# Patient Record
Sex: Female | Born: 1975 | Race: White | Hispanic: No | Marital: Married | Smoking: Never smoker
Health system: Southern US, Community
[De-identification: ages and names within clinical notes are randomized; demographics above are authoritative.]

---

## 1998-05-15 ENCOUNTER — Emergency Department (HOSPITAL_COMMUNITY): Admission: EM | Admit: 1998-05-15 | Discharge: 1998-05-15 | Payer: Self-pay | Admitting: Emergency Medicine

## 2000-12-24 ENCOUNTER — Encounter: Payer: Self-pay | Admitting: Emergency Medicine

## 2000-12-24 ENCOUNTER — Emergency Department (HOSPITAL_COMMUNITY): Admission: EM | Admit: 2000-12-24 | Discharge: 2000-12-24 | Payer: Self-pay | Admitting: Emergency Medicine

## 2001-05-18 ENCOUNTER — Other Ambulatory Visit: Admission: RE | Admit: 2001-05-18 | Discharge: 2001-05-18 | Payer: Self-pay | Admitting: Obstetrics and Gynecology

## 2001-12-07 ENCOUNTER — Inpatient Hospital Stay (HOSPITAL_COMMUNITY): Admission: AD | Admit: 2001-12-07 | Discharge: 2001-12-11 | Payer: Self-pay | Admitting: Obstetrics and Gynecology

## 2001-12-12 ENCOUNTER — Encounter: Admission: RE | Admit: 2001-12-12 | Discharge: 2002-01-11 | Payer: Self-pay | Admitting: Obstetrics and Gynecology

## 2001-12-14 ENCOUNTER — Inpatient Hospital Stay (HOSPITAL_COMMUNITY): Admission: AD | Admit: 2001-12-14 | Discharge: 2001-12-16 | Payer: Self-pay | Admitting: *Deleted

## 2002-01-20 ENCOUNTER — Other Ambulatory Visit: Admission: RE | Admit: 2002-01-20 | Discharge: 2002-01-20 | Payer: Self-pay | Admitting: Obstetrics and Gynecology

## 2002-11-28 ENCOUNTER — Other Ambulatory Visit: Admission: RE | Admit: 2002-11-28 | Discharge: 2002-11-28 | Payer: Self-pay | Admitting: Obstetrics & Gynecology

## 2003-06-15 ENCOUNTER — Inpatient Hospital Stay (HOSPITAL_COMMUNITY): Admission: AD | Admit: 2003-06-15 | Discharge: 2003-06-15 | Payer: Self-pay | Admitting: Obstetrics & Gynecology

## 2003-06-26 ENCOUNTER — Inpatient Hospital Stay (HOSPITAL_COMMUNITY): Admission: RE | Admit: 2003-06-26 | Discharge: 2003-07-03 | Payer: Self-pay | Admitting: Obstetrics & Gynecology

## 2003-06-26 ENCOUNTER — Encounter (INDEPENDENT_AMBULATORY_CARE_PROVIDER_SITE_OTHER): Payer: Self-pay | Admitting: Specialist

## 2003-08-01 ENCOUNTER — Other Ambulatory Visit: Admission: RE | Admit: 2003-08-01 | Discharge: 2003-08-01 | Payer: Self-pay | Admitting: Obstetrics & Gynecology

## 2005-02-07 ENCOUNTER — Other Ambulatory Visit: Admission: RE | Admit: 2005-02-07 | Discharge: 2005-02-07 | Payer: Self-pay | Admitting: Obstetrics & Gynecology

## 2006-07-03 ENCOUNTER — Emergency Department: Payer: Self-pay | Admitting: Internal Medicine

## 2007-02-11 HISTORY — PX: ABDOMINAL HYSTERECTOMY: SHX81

## 2007-08-24 ENCOUNTER — Encounter (INDEPENDENT_AMBULATORY_CARE_PROVIDER_SITE_OTHER): Payer: Self-pay | Admitting: Obstetrics & Gynecology

## 2007-08-24 ENCOUNTER — Ambulatory Visit (HOSPITAL_COMMUNITY): Admission: RE | Admit: 2007-08-24 | Discharge: 2007-08-25 | Payer: Self-pay | Admitting: Obstetrics & Gynecology

## 2010-06-25 NOTE — H&P (Signed)
NAME:  Jasmine Jimenez, Jasmine Jimenez NO.:  [id number]   MEDICAL RECORD NO.:  [id number]          PATIENT TYPE:  AMB   LOCATION:  SDC                           FACILITY:  [facility]   PHYSICIAN:  Freddy Finner, M.D.   DATE OF BIRTH:  1975/10/08   DATE OF ADMISSION:  DATE OF DISCHARGE:                              HISTORY & PHYSICAL   ADMISSION DIAGNOSES:  1. Chronic pelvic pain.  2. Menorrhagia.  3. Ultrasound findings consistent with adenomyosis.  4. Failed control of symptoms with oral contraceptives.  5. Request for definitive surgical intervention, admitted now for      laparoscopically assisted vaginal hysterectomy.   HISTORY OF PRESENT ILLNESS:  The patient is a 35 year old white married  female with two living children, both of whom were delivered by cesarean  section, the first for failure to progress, the second is a repeat.  She  also had bilateral tubal sterilization at the time of her second C-  section and had a postoperative wound requiring primary closure of the  dehiscence.  For the last approximately two years the patient has  continued to complain of severe dysmenorrhea/menorrhagia.  She has been  tried on oral contraceptives for controlled bleeding even though she did  not need contraceptive protection.  Options of therapy were discussed  with her including endometrial ablation, although adenomyosis is a  relative contraindication for that procedure.  She also has been off her  intrauterine device.  Based on her definite decision already for no  further pregnancies, she has requested definitive surgical intervention  and is admitted now for the procedure noted above.  Because of her  previous abdominal procedures the possibility of injury to other organs  on entry has been discussed with the patient and the possibility of  having to complete the procedure by abdominal incision.  Also risk of  infection into the bladder, ureters, and vessels has been discussed  with  her and the possibility of intraoperative hemorrhage.  Prophylactic  measures intended to prevent these things including deep vein thrombosis  have been discussed and will be utilized.  She is now admitted for  surgery.   REVIEW OF SYSTEMS:  Her current review of systems:  There are no other  reported JR or GU symptoms and no cardiopulmonary symptoms.  She does  have a history of chronic back pain.  She has no other known significant  medical illnesses.  She does have anxiety disorder for which she takes  Paxil 10 mg a day.  She has allergic rhinitis for which she takes  Zyrtec.   PAST SURGICAL HISTORY:  Previous surgical procedures in addition to the  above noted procedures include wisdom teeth extraction.   PAST MEDICAL HISTORY:  She does have a history of chronic idiopathic  urticaria.   ALLERGIES:  She also has an allergy to AMOXICILLIN, but to no other  antibiotics or other drugs.   SOCIAL HISTORY:  She has never required a blood transfusion.  She is not  a cigarette smoker.   FAMILY HISTORY:  Noncontributory.   PHYSICAL EXAMINATION:  HEENT:  Grossly within normal limits.  VITAL SIGNS:  Blood pressure in the office 110/62, height 5 feet 7-1/2  inches, weight 259 pounds.  NECK:  Thyroid gland is not palpably enlarged.  BREASTS:  Breast exam was considered to be normal.  No palpable masses,  no nipple discharge or skin change.  HEART:  Normal sinus rhythm without murmurs, rubs, or gallops.  CHEST:  Clear to auscultation throughout.  ABDOMEN:  Moderately obese, but no palpable organomegaly or palpable  masses.  PELVIC:  External genitalia, vagina, and cervix are normal to  inspection.  Her exam is compromised by body habitus but there is no  palpable enlargement on bimanual examination and no palpable adnexal  masses.  RECTUM:  Normal.  Rectovaginal exam confirms the above findings.   ASSESSMENT:  1. Chronic pelvic pain, severe dysmenorrhea/menorrhagia,  unresponsive      to cyclic hormonal therapy, specifically oral contraceptives.  2. The patient does have recent history of left low back pain and left      leg pain, a recent symptom which is not thought to be GYN in origin      and the possibility of lumbar disk has been discussed with the      patient.   PLAN:  Laparoscopically-assisted vaginal hysterectomy, serial  compression devices for antiembolic prophylaxis of the antibiotic for  infection prophylaxis.      Freddy Finner, M.D.  Electronically Signed     WRN/MEDQ  D:  08/23/2007  T:  08/23/2007  Job:  [id number]

## 2010-06-25 NOTE — Op Note (Signed)
NAME:  Jasmine Jimenez, Jasmine Jimenez NO.:  [id number]   MEDICAL RECORD NO.:  [id number]          PATIENT TYPE:  OIB   LOCATION:  9315                          FACILITY:  [facility]   PHYSICIAN:  Freddy Finner, M.D.   DATE OF BIRTH:  05-02-1975   DATE OF PROCEDURE:  08/24/2007  DATE OF DISCHARGE:  08/25/2007                               OPERATIVE REPORT   PREOPERATIVE DIAGNOSES:  Myometrial thickening and uterine enlargement  by ultrasound, consistent with adenomyosis.  Clinical symptoms of  menorrhagia, severe dysmenorrhea.   POSTOPERATIVE DIAGNOSES:  Myometrial thickening and uterine enlargement  by ultrasound, consistent with adenomyosis.  Clinical symptoms of  menorrhagia, severe dysmenorrhea.   SECONDARY DIAGNOSIS:  Omental adhesions to the anterior abdominal wall  from previous abdominal surgery.   OPERATIVE PROCEDURE:  Laparoscopically-assisted vaginal hysterectomy.   SURGEON:  Freddy Finner, M.D.   ASSISTANT:  Zelphia Cairo, M.D.   ESTIMATED INTRAOPERATIVE BLOOD LOSS:  Less than or equal to 300 mL.   INTRAOPERATIVE COMPLICATIONS:  None.   NOTE:  I am dictating this note on August 28, 2007.  It became apparent to  me today when I checked medical records, that I had not dictated the  note.  I am using photographs made at the time of surgery which will be  retained in the office records to refresh my memory and confirm the  accuracy of this dictation.   DESCRIPTION OF PROCEDURE:  The patient was admitted on the morning of  surgery.  She was given a bolus of Ancef IV.  She was taken to the  operating room and there placed under adequate general endotracheal  anesthesia.  She was placed in serial compression devices on her lower  extremities.  After establishing general anesthesia, the abdomen,  perineum and vagina were prepped in the usual fashion.  Bladder was  evacuated using a sterile catheter.  Hulka tenaculum was attached to the  cervix without difficulty.   A  small infraumbilical skin incision was made, and through it an 11 mm  blade and disposable trocar introduced.  Direct inspection revealed  adequate placement and no evidence of injury on entry.  Pneumoperitoneum  was allowed to accumulate with carbon dioxide gas.  A second, smaller  incision was made just above the symphysis and through it a 5 mm trocar  was placed again, under direct visualization.  Previously a blunt probe  and later an __________system and spring-loaded grasping forceps were  used for traction, irrigation and exposure during the procedure.  Inspection of the abdomen revealed no apparent abnormalities except for  omental adhesions to the anterior abdominal wall.  These were in no way  producing a blind loop or attached to bowel or other structures, and  were not removed.  There was what appeared to be fatty change of the  liver, but no other apparent abnormality.  In the upper abdomen, the  uterus itself was noted to be significantly enlarged.  Tubes and ovaries  were normal.  Peritoneal surfaces in the pelvis were normal.  The new  Ethicon tripolar device was  used through the operating channel of the  laparoscope to develop, seal, and divide the utero-ovarian pedicle of  the fallopian tube, the round ligament, the upper broad ligament down to  a level just above the uterine arteries on each side.  The bladder was  released anteriorly.   Attention was then turned vaginally.  The posterior weighted vaginal  retractor was placed.  Deaver retractor were used anteriorly and  laterally for exposure.  The cervix was grasped with a Christella Hartigan after  removing the Hulka, colpotomy incision was made while tenting the mucosa  posterior to the cervix.  The cervix was circumscribed with a scalpel to  release the mucosa.  The banana style weighted retractor was placed.  The LigaSure system was then used to seal and divide the uterosacral  pedicles and bladder __________.  The bladder was  carefully advanced off  the cervix with blunt and sharp dissection.  Anterior peritoneum was  entered.  The cardinal ligament pedicles were sealed and divided with  LigaSure system.  The dorsal pedicles were sealed and divided with the  LigaSure system.  This allowed delivery of the uterus through the  vaginal introitus.  The regular posterior weighted retractor was placed.  The angles of the vagina were anchored to the uterosacrals with mattress  sutures of 0 Monocryl.  Uterosacrals were plicated.  The posterior  peritoneal closed with interrupted 0 Monocryl.  Fascia was closed  vertically with figures-of-eight with 0 Monocryl.  Foley catheter was  placed.  Reinspection abdominally through the laparoscope was carried  out.  The  __________ system is utilized.  Blood and fluid in the  abdomen are aspirated.  Complete hemostasis was confirmed under  additional intra-abdominal pressure and irrigation.  At this point the  procedure was terminated.  The instruments were removed.  Gas was  allowed to escape from the abdomen.  Skin incisions were anesthetized  with 0.25% Marcaine.  The skin incisions were closed with interrupted  subcuticular sutures of 3-0 Dexon.  Steri-Strips were applied to the  lower incision.  Sterile dressing was applied at the umbilicus.   The patient was awakened, taken to recovery in good condition.      Freddy Finner, M.D.  Electronically Signed     WRN/MEDQ  D:  08/28/2007  T:  08/28/2007  Job:  [id number]

## 2010-06-28 NOTE — H&P (Signed)
NAME:  Jasmine Jimenez, Jasmine Jimenez                           ACCOUNT NO.:  [id number]   MEDICAL RECORD NO.:  [id number]                   PATIENT TYPE:  INP   LOCATION:  9101                                 [facility]ACILITY:  [facility]   PHYSICIAN:  Freddy Finner, M.D.                DATE OF BIRTH:  1975/05/20   DATE OF ADMISSION:  06/26/2003  DATE OF DISCHARGE:                                HISTORY & PHYSICAL   ADMISSION DIAGNOSES:  1. Intrauterine pregnancy at term.  2. Surgically scarred uterus.  3. History of severe pregnancy induced hypertension with first pregnancy.  4. Declines vaginal birth after cesarean.   HISTORY OF PRESENT ILLNESS:  The patient is a 35 year old Gravida II, Para 1  who had severe PIH with her first pregnancy and had actual loss of vision in  her right eye following that delivery.  This subsequently resolved and this  produced no additional problems, but the patient has had significant anxiety  over potential recurrence of problems during this pregnancy.  Fortunately  her prenatal course has been uncomplicated.  Her blood pressure has remained  normal with no evidence of recurrence of the pregnancy induced hypertension.   REVIEW OF SYMPTOMS:  Current review of systems is negative.   PAST MEDICAL HISTORY:  Recorded in detail in the prenatal summary and will  not be repeated.   SOCIAL HISTORY:  Recorded in detail in the prenatal summary and will not be  repeated.   PHYSICAL EXAMINATION:  HEENT:  Grossly within normal limits.  Thyroid gland  is not palpably enlarged.  CHEST:  Clear to auscultation.  HEART:  Normal sinus rhythm without murmurs, rubs or gallops.  ABDOMEN:  Gravid.  Estimated fetal weight is 8 pounds or less.  PELVIC EXAM:  Deferred.  EXTREMITIES:  +1 edema without cyanosis or clubbing.   ASSESSMENT:  1. Intrauterine pregnancy at term, uncomplicated prenatal course.  2. Surgically scarred uterus.   PLAN:  Repeat cesarean delivery.                        Freddy Finner, M.D.    WRN/MEDQ  D:  06/26/2003  T:  06/26/2003  Job:  [id number]

## 2010-06-28 NOTE — Op Note (Signed)
NAME:  Jasmine Jimenez, Jasmine Jimenez                           ACCOUNT NO.:  [id number]   MEDICAL RECORD NO.:  [id number]                   PATIENT TYPE:  INP   LOCATION:  9101                                 [facility]ILITY[facility]   PHYSICIAN:  Freddy Finner, M.D.                DATE OF BIRTH:  Jan 04, 1976   DATE OF PROCEDURE:  06/26/2003  DATE OF DISCHARGE:                                 OPERATIVE REPORT   PREOPERATIVE DIAGNOSES:  1. Intrauterine pregnancy at term.  2. Surgically-scarred uterus.  3. Refused vaginal birth after cesarean.   SECONDARY DIAGNOSIS:  Multiparity, request for sterilization.   OPERATIVE PROCEDURE:  Repeat cesarean section and bilateral tubal ligation.   ANESTHESIA:  Spinal.   INTRAOPERATIVE COMPLICATIONS:  Initial difficulty with delivery due to  marked scarring of the incision and anterior abdominal wall.   OTHER INTRAOPERATIVE COMPLICATIONS:  None.   The infant was a viable female, Apgars were 8 and 9, arterial cord pH was  7.16.   The patient is a 35 year old with one living child who delivered by cesarean  with her first baby.  She is admitted now after an uneventful prenatal  course for repeat cesarean delivery.  She was admitted on the morning of  surgery.  She was brought to the operating room, there placed under spinal  anesthesia, placed in the dorsal recumbent position.  Some difficulty was  experienced with the spinal with getting a complete block and repositioning  did help some, but local infusion of 0.25% Marcaine was required for the  procedure and this included anesthetizing the skin and fascia on the right  side of the incision.  The incision was made through an old scar and carried  sharply down to fascia.  The fascia was entered sharply and extended to the  extent of skin incision.  The rectus sheath was developed superiorly and  inferiorly with blunt and sharp dissection.  Marked scarring was noted along  the way.  The rectus muscles were divided in the  midline.  The peritoneum  was elevated, entered sharply, and extended bluntly to the extent of the  skin incision.  The scarring restricted the opening.  It was felt that under  standard technique all had been accomplished that could be and for that  reason an incision was made in the lower uterine segment above the bladder  reflection.  The fluid was clear.  The uterine incision was extended bluntly  with transverse dissection.  The infant was right occiput transverse.  It  was floating out of the pelvis. Initial attempts to apply the [facility] and  another type of vacuum extractor were unsuccessful.  At this point it was  elected to extend the incision in the uterus in a vertical fashion at the  midline of the incision and to extend an incision in the anterior abdominal  wall through the fascia and subcutaneous tissue and  skin.  This did in fact  then allow delivery of the infant.  A single loose nuchal cord was removed.  There was a moderate amount of bleeding before actual delivery of the fetus.  The Apgars are noted above and cord pH is noted above.  Placenta and other  products of conception were then removed from the uterus.  The uterus was  delivered onto the anterior abdominal wall.  Inspection revealed normal  tubes and ovaries.  The uterus itself was normal except for some very tiny  subserosal myomas noted on the anterior fundal surface.  These measured no  more than 5 mm each and were very flat.  It was elected not to attempt to  dissect these.  The uterine incision was then closed with a running locking  0 Monocryl.  The mid portion of each tube was elevated, doubly ligated with  0 plain ties, and a segment of tube excised.  When an attempt was made to  deliver the uterus back into the abdominal cavity, the tie on the right tube  evulsed.  The tube was then clamped and each side ligated again with 0 plain  ties.  This produced complete hemostasis.  Irrigation was carried out.  The   abdominal incision was then closed in layers.  Running 0 Monocryl was used  to close the rectus muscles.  The vertical incision on the uterus was also  closed with a running 0 Monocryl before complete closure of the uterine  incision.  On the abdominal portion of the incision, the fascia was closed  with a running 0 PDS running from the apex down to the primary incision  line.  The transverse fascial incision was then closed with a running  locking 0 Monocryl.  A 2-0 plain suture on a large needle was then used to  approximate the subcutaneous tissue of the vertical portion of the incision  and the subcutaneous tissue immediately inferior to the vertical portion of  the transverse incision.  The skin was then closed with wide skin staples.  The patient tolerated the operative procedure well.  She was awakened as she  had been sedated somewhat.  She was then taken to the recovery room in good  condition.                                               Freddy Finner, M.D.    WRN/MEDQ  D:  06/26/2003  T:  06/26/2003  Job:  [id number]

## 2010-06-28 NOTE — Discharge Summary (Signed)
NAME:  Jasmine Jimenez, Jasmine Jimenez                           ACCOUNT NO.:  [id number]   MEDICAL RECORD NO.:  [id number]                   PATIENT TYPE:  INP   LOCATION:  9101                                 [facility]   PHYSICIAN:  Juluis Mire, M.D.                DATE OF BIRTH:  14-Aug-1975   DATE OF ADMISSION:  06/26/2003  DATE OF DISCHARGE:  07/03/2003                                 DISCHARGE SUMMARY   ADMISSION DIAGNOSES:  1. Intrauterine pregnancy at term.  2. Surgically scarred uterus, refuses vaginal birth after cesarean section.  3. Multiparity, requests sterilization.   DISCHARGE DIAGNOSIS:  1. Status post low transverse cesarean section.  2. Viable female infant.  3. Permanent sterilization.  4. Status post exploratory laparotomy with primary closure of wound     dehiscence.   PROCEDURES:  1. Repeat low transverse cesarean section.  2. Bilateral tubal ligation.  3. Exploratory laparotomy with primary closure of wound dehiscence.   REASON FOR ADMISSION:  Please see dictated H&P.   HOSPITAL COURSE:  The patient was a 35 year old gravida 2, para 1 who was  admitted to [facility] for scheduled cesarean section.  The  patient had had severe PIH with her first pregnancy and she had had  significant anxiety over the potential of reoccurrence of problems during  this pregnancy.  Fortunately, her prenatal care had been uncomplicated.  Blood pressure had remained normal with no evidence of reoccurrence of  pregnancy induced hypertension.  On the morning of admission, the patient  was taken to the operating room where spinal anesthesia was administered  without difficulty.  A low transverse incision was made with the delivery of  a viable female infant weighing 7 pounds 2 ounces with Apgars of 8 at one  minute and 9 at five minutes.  Umbilical cord pH was 7.16.  The patient  tolerated the procedure well and was taken to the recovery room in stable  condition.   On  postoperative day #1, the patient was without complaints.  Vital signs  were stable.  She was afebrile.  Abdomen was soft with good return of bowel  function.  Fundus was firm and nontender.  Abdominal dressing was partially  removed revealing an incision that was clean, dry and intact; however, it  was T-shaped.  Lungs were clear to auscultation.  Labs revealed hemoglobin  of 8.0.   On postoperative day #2, the patient was without complaints.  Vital signs  were stable.  Abdomen was soft.  Fundus was firm and nontender.  Incision  was noted to have a small amount of bleeding noted from the lower one third  of the T-shaped incision.  Steri-Strips were replaced and abdominal dressing  was reapplied.  The patient was ambulating well.  Labs revealed hemoglobin  of 8.2.  Platelet count of 178,000, wbc count of 7.4.   On postoperative day #3,  vital signs were stable.  She was afebrile.  Abdomen was soft.  Fundus was u-even and slightly tender to examination.  Incision was noted to have serosanguineous fluids leaking from the lower one  third of the incision.  Findings were discussed with Dr. Henderson Cloud who  remained from the lower one third of the T-shaped incision.  Omentum was  immediately protruding from the skin.  Sterile wet dressing was applied and  the patient was transferred from the operating room for exploratory  laparotomy and closure of her wound dehiscence.  After transferring the  patient to the operating room, general anesthesia was administered.  Abdomen  was prepped and the patient was given Avelox, IV antibiotics  prophylactically.  Entire vertical fascial incision was noted to be apart  once the Steri-Strips and staples were removed.  Omentum was protruding  through the incision.  Very careful inspection revealed that no bowel had  dropped below the level of the uterine fundus and there was no bowel at all  noted to be at the incisional site.  There were two small adhesions of   omentum that were adhered to the incision which were taken down with  cautery.  A very small edge of the omentum was removed where it had been  adherent to the incisional site.  The remainder of the omentum looked vital  without evidence of strangulation or other lesion.  Continuing suture was  removed from the Pfannenstiel and portion of the incision as well and al the  old suture was taken away.  Peritoneum was closed and the vertical aspect of  the fascia was closed.  The Pfannenstiel portion of the incision was also  closed in a very similar fashion.  A pressure dressing was applied.  The  patient tolerated the procedure well and was then taken to the recovery room  in stable condition.  The following morning, vital signs were stable.  The  patient was afebrile.  Abdomen was soft with good return of bowel function.  Abdominal dressing was noted to be clean, dry and intact.  Fundus was firm.  Urine output was adequate.   On postoperative day #2 of the exploratory laparotomy, vital signs were  stable.  Hemoglobin was noted to be 8.9.  Abdominal dressing was clean, dry  and intact.  She was ambulating well without assistance.  The baby continued  to be in the NICU.   On postoperative day #3 the exploratory laparotomy, the baby continued in  the NICU.  Vital signs were stable.  Abdomen was soft with good return of  bowel function.  Incision was clean, dry and intact. The patient was  tolerating a regular diet without complications of nausea and vomiting.  On  the following morning, baby was doing well.  Baby had been noted to have  some seizures which were thought to be secondary to some low calcium levels.  Vital signs were stable.  Abdomen was soft.  Fundus was firm and nontender.  Incision was clean, dry and intact.  Instructions were given and the patient  was discharged home.   CONDITION ON DISCHARGE:  Good.   DIET:  Regular as tolerated.  ACTIVITY:  No heavy lifting, no driving  x2 weeks, no vaginal entry.   FOLLOW UP:  The patient is to follow up in the office in two to three days  for removal of staples and she should return to the office in approximately  one week for an incision check.  She  is to call for temperature greater than  100 degrees, persistent nausea and vomiting, heavy vaginal bleeding, and/or  redness or drainage from the incisional site.   DISCHARGE MEDICATIONS:  1. Tylox #30 one p.o. q.4-6h. p.r.n. pain.  2. Iron supplementation.  3. Ferrous sulfate 325 mg one p.o. b.i.d.  4. Prenatal vitamins one p.o. daily.     Julio Sicks, N.P.                        Juluis Mire, M.D.    CC/MEDQ  D:  07/23/2003  T:  07/24/2003  Job:  [id number]   cc:   Juluis Mire, M.D.  [address] [place]  [place] [address]  Fax: [phone]

## 2010-06-28 NOTE — Discharge Summary (Signed)
NAME:  Jasmine Jimenez, Jasmine Jimenez NO.:  [id number]   MEDICAL RECORD NO.:  [id number]                   PATIENT TYPE:   LOCATION:                                       FACILITY:   PHYSICIAN:  Julio Sicks, N.P.                  DATE OF BIRTH:   DATE OF ADMISSION:  DATE OF DISCHARGE:                                 DISCHARGE SUMMARY   ADMISSION DIAGNOSES:  1. Intrauterine pregnancy at 62 and 5/7th weeks estimated gestational age.  2. Pregnancy induced hypertension.  3. Induction of labor.   DISCHARGE DIAGNOSES:  1. Status post low transverse Cesarean section secondary to failure to     progress.  2. Viable female infant.   PROCEDURE:  Primary low transverse Cesarean section.   REASON FOR ADMISSION:  Please see written history and physical.   HISTORY OF PRESENT ILLNESS:  The patient is a 35 year old female at 52 and  5/7th weeks estimated gestational age. She presented to the office for a  routine OB visit, where blood pressure was noted to be 190/104 and with 1+  proteinuria. The patient also complained of a mild headache and mild right  upper quadrant pain. Cervix was fingertip dilated.   HOSPITAL COURSE:  The patient was admitted for induction of labor. Cytotec  was administered inter-vaginally for cervical ripening. Pitocin was  administered IV. Deep tendon reflexes of 1-2+ without clonus. Fetal heart  tones reactive in the 140's. After 8-10 hours of IV Pitocin, cervix was  essentially unchanged. She had worsening of her clinical symptoms and  hypertension during labor and was started on magnesium sulfate. Due to  failure to progress, decision was made to proceed with a Cesarean delivery.  The patient was taken to the OR where spinal anesthesia was administered  without difficulty. A low transverse incision was made with the delivery of  a viable female infant weighing 7 pounds and 2 ounces with Apgar's of 9 at one  minute and 9 at five minutes. The  patient tolerated the procedure well and  was taken to the recovery room  in stable condition. On postoperative day  one, patient was taken to the AICU where magnesium sulfate was administered  for 24 hours. Abdomen was soft and nontender. Abdominal dressing was clean,  dry, and intact. On postoperative day two, the patient was stable. Abdomen  was soft. Incision was clean, dry, and intact. The patient was tolerating a  regular diet without complaints of nausea and vomiting. She was started on  Zoloft. On postoperative day three, abdomen was soft. Incision was clean,  dry, and intact. Staples were removed and the patient was discharged to  home.   LABORATORY DATA:  Hemoglobin 9.5, platelet count 199,000, WBC count 12.1 and  liver function studies were within normal limits.   CONDITION ON DISCHARGE:  Good.   DIET:  Regular as tolerated.  ACTIVITY:  No heavy lifting, no vaginal entry, and no driving times two  weeks.   FOLLOW UP:  The patient is to follow-up in the office in one to two weeks  for an incision check. She is to call for temperature greater than 100  degrees, persistent nausea and vomiting, heavy vaginal bleeding and/or  redness or drainage of the incision site.   DISCHARGE MEDICATIONS:  1. Tylox #30 one by mouth every four to six hours as needed pain.  2. Motrin 600 mg every six hours as needed.  3. Prenatal vitamins one by mouth daily.  4. Colace one by mouth daily as needed.  5. Zoloft 50 mg one by mouth daily.                                               Julio Sicks, N.P.    CC/MEDQ  D:  02/04/2002  T:  02/05/2002  Job:  [id number]   cc:   Dineen Kid. Rana Snare, M.D.  [address]  [place]  [place] [address]  Fax: [phone]

## 2010-06-28 NOTE — H&P (Signed)
   NAME:  Jasmine Jimenez, Jasmine Jimenez                             ACCOUNT NO.:  [id number]   MEDICAL RECORD NO.:  [id number]                   PATIENT TYPE:  INP   LOCATION:  9164                                 [facility]ILITY:  [facility]   PHYSICIAN:  Duke Salvia. Marcelle Overlie, M.D.            DATE OF BIRTH:  [birth year]   DATE OF ADMISSION:  12/07/2001  DATE OF DISCHARGE:                                HISTORY & PHYSICAL   CHIEF COMPLAINT:  Preeclampsia at term.   HISTORY OF PRESENT ILLNESS:  The patient is a 35 year old G1, P0 female at  11-5/7ths weeks' gestation who presents for her OB visit today.  She was  noted to have 1+ protein, BP 190/104.  She has had some mild headache and  mild right upper quadrant pain . The cervix was tight fingertip and long.  She is admitted at this time for labor induction.   Group B Strep screen was negative, one-hour GGT was 147, three-hour GGT was  normal, blood type O+, rubella titers immune.   ALLERGIES:  Allergic to AMOXICILLIN.   REVIEW OF SYSTEMS:  Significant for acid reflux.  Otherwise negative except  for anxiety disorder.   PHYSICAL EXAMINATION:  GENERAL:  Temperature 98.2, blood pressure 190/104,  1+ protein.  HEENT:  Neck supple without masses.  LUNGS:  Clear.  CARDIOVASCULAR:  Regular rate and rhythm without murmurs, rubs or gallops.  BREASTS:  Not examined.  ABDOMEN:  Term fundal height.  Fetal heart rate 140.  PELVIC:  Cervix is tight fingertip, long, posterior, vertex.  EXTREMITIES:  2+ edema.  Reflexes 1-2+, no clonus.   IMPRESSION:  1. Preeclampsia at term.   PLAN:  1. Cytotec followed by Pitocin labor induction.                                               Richard M. Marcelle Overlie, M.D.    RMH/MEDQ  D:  12/07/2001  T:  12/08/2001  Job:  [id number]

## 2010-06-28 NOTE — Discharge Summary (Signed)
   NAME:  Jasmine Jimenez, Jasmine Jimenez                             ACCOUNT NO.:  [id number]   MEDICAL RECORD NO.:  [id number]                   PATIENT TYPE:  INP   LOCATION:  9181                                 [facility]ILITY[facility]   PHYSICIAN:  Duke Salvia. Marcelle Overlie, M.D.            DATE OF BIRTH:  05/14/1975   DATE OF ADMISSION:  12/14/2001  DATE OF DISCHARGE:  12/16/2001                                 DISCHARGE SUMMARY   DISCHARGE DIAGNOSIS:  Postpartum preeclampsia.   HISTORY OF PRESENT ILLNESS:  For summary of history and physical exam,  please see admission H&P for details.  This is a 35 year old, G1, P1, six  days postpartum after a cesarean section which was complicated by severe  preeclampsia was evaluated for severe headache with elevated blood pressure  190/105 with brisk reflexes and was admitted for postpartum preeclampsia  management.   LABORATORY DATA AND X-RAY FINDINGS:  On admission, LFTs were normal.  Platelet count was 294,000 with a creatinine of 0.7.  BP was 205/120 and  175/102.   HOSPITAL COURSE:  On admission, the patient was started on magnesium sulfate  4 g IV over 30 minutes followed by 2 g per hour.  She was started on  labetalol and Procardia.  By November 5, BPs were in the 140/80 range.  Her  scotoma and headache were much improved.  In the afternoon of November 5,  her magnesium sulfate was discontinued.  Her labetalol and Procardia was  continued with good response.  The following day on November 6, BP was  140/70.  She had no headache or visual disturbance.  She was feeling much  improved and was discharged on labetalol and Procardia.   DISCHARGE MEDICATIONS:  1. Zoloft 100 mg q.d.  2. Zyrtec p.r.n.  3. Prenatal vitamins.  4. Labetalol 100 mg p.o. b.i.d.  5. Procardia XL 60 mg p.o. q.d.   DISCHARGE LABORATORY DATA AND X-RAY FINDINGS:  Hemoglobin 10.8, WBC 9,  platelets 294,000.  CMET was normal except for sodium slightly elevated at  148, potassium slightly low at  3.1 and BUN slightly low at 5.   FOLLOW UP:  The patient will return to the office in three to four days for  a blood pressure check.   CONDITION ON DISCHARGE:  Good.   ACTIVITY:  Normal postoperative cesarean section instructions were reviewed.   SPECIAL INSTRUCTIONS:  She will call if she has any persistent headache or  return of any visual complaints.                                               Richard M. Marcelle Overlie, M.D.   RMH/MEDQ  D:  01/14/2002  T:  01/15/2002  Job:  [id number]

## 2010-06-28 NOTE — Discharge Summary (Signed)
NAME:  Jasmine Jimenez, Jasmine Jimenez NO.:  [id number]   MEDICAL RECORD NO.:  [id number]          PATIENT TYPE:  OIB   LOCATION:  9315                          [facility]ILITY:  [facility]   PHYSICIAN:  Freddy Finner, M.D.   DATE OF BIRTH:  02-May-1975   DATE OF ADMISSION:  08/24/2007  DATE OF DISCHARGE:  08/25/2007                               DISCHARGE SUMMARY   DISCHARGE DIAGNOSIS:  Uterine enlargement with uterus weighing 133  grams.  Clinical symptoms and ultrasound findings consistent with  adenomyosis, not confirmed histologically.   CLINICAL SYMPTOMS:  Menorrhagia and severe dysmenorrhea.   OPERATIVE PROCEDURE:  Laparoscopic-assisted vaginal hysterectomy.   INTRAOPERATIVE AND POSTOPERATIVE COMPLICATIONS:  None.   DISPOSITION:  The patient was in satisfactory condition on the first  postoperative day.  She was ambulating without assistance, having  adequate bowel and bladder function, and tolerating regular diet.  She  was discharged home; to avoid heavy lifting; to call for fever, severe  pain, or heavy vaginal bleeding.  She was given Percocet 5/325 to be  taken as needed for postoperative pain.  She is to be followed in the  office in 2 weeks.   Details of the present illness, past history, family history, review of  systems, and physical exam recorded in the admission note.  Briefly, the  patient's exam was remarkable only for uterine enlargement and  ultrasound findings most consistent with adenomyosis.   LABORATORY DATA:  During this admission included a normal CBC with a  hemoglobin of 13.2 on admission.  Prothrombin time, PTT, and INR were  all normal on admission.  Postoperative hemoglobin was 11.1.   HOSPITAL COURSE:  The patient was admitted on the day of surgery.  She  was treated preoperatively with PAS compression hose and with IV  antibiotic bolus.  The above-described operating procedure was  accomplished without difficulty or intraoperative complications.  By  the  morning of the first postoperative day, she had remained afebrile  throughout.  She was having adequate bowel and bladder function and  tolerating a regular diet.  She was discharged home with further  disposition as noted above.      Freddy Finner, M.D.  Electronically Signed     WRN/MEDQ  D:  09/24/2007  T:  09/25/2007  Job:  [id number]

## 2010-06-28 NOTE — H&P (Signed)
NAME:  Jasmine Jimenez, WAGENAAR NO.:  [id number]   MEDICAL RECORD NO.:  [id number]                   PATIENT TYPE:  MAT   LOCATION:  MATC                                 [facility]ACILITY:  [facility]   PHYSICIAN:  Tracie Harrier, M.D.              DATE OF BIRTH:  07-02-1975   DATE OF ADMISSION:  12/14/2001  DATE OF DISCHARGE:                                HISTORY & PHYSICAL   HISTORY OF PRESENT ILLNESS:  The patient is a 35 year old female gravida 1,  para 1 status post recent cesarean section at term for preeclampsia which  was severe.  She is readmitted today, six days postpartum with a complaint  of severe headache and extremely elevated blood pressures.  Her postpartum  course was complicated by headache and today upon admission her blood  pressure in the office was 190/105.  She also had brisk reflexes and  admitted for magnesium tocolysis and management of blood pressure.   OB LABORATORIES:  Recent hemoglobin checked 9.5.  Blood type O+.  Rubella  immune.  Three hour glucose tolerance test normal.  Group B Strep negative.   PAST MEDICAL HISTORY:  1. History of anxiety disorder (treated with Zoloft in the past).  2. History of GERD.   PAST SURGICAL HISTORY:  Cesarean section December 08, 2001 a female weighing 7  pounds 2 ounces.  Induction was done due to severe preeclampsia.  Past  surgical history significant only for recent cesarean section.   CURRENT MEDICATIONS:  Percocet p.r.n.   ALLERGIES:  AMOXICILLIN.   PHYSICAL EXAMINATION:  VITAL SIGNS:  Stable.  Temperature 97.4, pulse 84,  respirations 20, blood pressure 205/120, 175/102.  GENERAL:  She is a well-developed, well-nourished female in no acute  distress.  HEENT:  Within normal limits.  NECK:  Supple without adenopathy or thyromegaly.  HEART:  Regular rate and rhythm without murmur, rub, or gallop.  LUNGS:  Clear to auscultation.  BREASTS:  Deferred.  ABDOMEN:  Soft and benign.  Her low transverse  incision is healing well  without signs of breakdown or infection.  PELVIC:  Uterus is nontender at U -4.  Pelvic examination is deferred.  EXTREMITIES:  Deep tendon reflexes 3+ with two beats of clonus.   ADMISSION LABORATORIES:  Normal.  Platelet count 294,000.  Liver function  tests are normal.  BUN is 5, creatinine 0.7.   ADMITTING DIAGNOSES:  1. Postpartum hypertension.  2. Preeclampsia.   PLAN:  1. Admit.  2. Magnesium IV seizure prophylaxis.  3. Blood pressure management.  4. Pain medicine for headache.                                               Tracie Harrier, M.D.    REG/MEDQ  D:  12/14/2001  T:  12/15/2001  Job:  [id number]

## 2010-06-28 NOTE — Op Note (Signed)
NAME:  Jasmine, Jimenez                           ACCOUNT NO.:  [id number]   MEDICAL RECORD NO.:  [id number]                   PATIENT TYPE:  INP   LOCATION:  9101                                 [facility]ACILITY:  [facility]   PHYSICIAN:  Guy Sandifer. Arleta Creek, M.D.           DATE OF BIRTH:  March 26, 1975   DATE OF PROCEDURE:  06/29/2003  DATE OF DISCHARGE:                                 OPERATIVE REPORT   PREOPERATIVE DIAGNOSES:  Postoperative fascial dehiscence.   POSTOPERATIVE DIAGNOSES:  Postoperative fascial dehiscence.   PROCEDURE:  Exploratory laparotomy with primary closure of dehiscence.   SURGEON:  Guy Sandifer. Henderson Cloud, M.D.   ANESTHESIA:  General with endotracheal intubation, Raul Del, M.D.   ESTIMATED BLOOD LOSS:  Less than 50 mL.   INDICATIONS FOR PROCEDURE:  This patient is a 35 year old G2, P2 status post  repeat cesarean section on Jun 26, 2003.  During that surgery, the skin and  fascia underwent an inverted T incision when the Pfannenstiel incision  needed to be expanded to allow delivery for the baby.  Since that time, the  patient has been feeling well. She has remained afebrile.  On May 18,  hemoglobin is 8.2, white count is 7.4, platelets are 178,000.  However, she  has had increasingly heavy serous drainage from the incision that was  soaking dressings.  This morning, the lower 3-4 staples in the vertical  aspect of the inverted T shaped skin incision were removed and the omentum  was noted to immediately pop up through the incision. At that point, the  rest of the staples were left in. A sterile dressing that was moistened with  saline was applied. The patient was left in the supine position and plans  were made for surgery. Exploratory laparotomy was discussed with the  patient.  Potential risks and complications were reviewed preoperatively  including but not limited to infection, organ damage, bleeding and  transfusion, DVT, PE and pneumonia. All questions were  answered.  Consent is  signed and on the chart.   DESCRIPTION OF PROCEDURE:  The patient was taken to the operating room where  she is placed in the dorsal supine position and general anesthesia is  induced via endotracheal intubation.  She is then prepped widely on the  abdomen and a Foley catheter is placed and the bladder is drained and she is  draped in a sterile fashion.  Avelox 400 mg IV had been ordered and was hung  as soon as it was available. PAS hose were also placed on the patient at the  beginning of surgery.  The Steri-Strips and staples were removed. The entire  vertical fascial incision had fallen apart.  Omentum was protruding through  the incision. Very careful inspection revealed that no bowel had dropped  below the level of the uterine fundus and there was no bowel at all in the  incision.  There were two  small adhesions of the omentum to the incision  which were taken down with cautery.  A small edge of the omentum was removed  where it had been adherent.  The remainder of the omentum looked vital  without evidence of strangulation or other lesion. Copious irrigation was  carried out throughout the surgery.  The continuing suture was removed from  the Pfannenstiel portion of the incision as well. All of the old suture was  taken away.  The peritoneum was then closed in a running fashion with #0  chromic suture.  The vertical aspect of the fascia was then closed with  interrupted #1 novofil suture in a Smead-Jones fashion.  The Pfannenstiel  portion of the incision was closed in a similar fashion.  The zero plain suture was used to close the subcutaneous tissues and the  skin was closed with clips.  There was good apposition of the skin edges.  A  pressure dressing was applied.  All counts were correct. The patient was  awakened and taken to the recovery room in stable condition.                                               Guy Sandifer Arleta Creek, M.D.    JET/MEDQ   D:  06/29/2003  T:  06/29/2003  Job:  [id number]

## 2010-06-28 NOTE — Op Note (Signed)
NAME:  Jasmine Jimenez, CANNY NO.:  [id number]   MEDICAL RECORD NO.:  [id number]                   PATIENT TYPE:  INP   LOCATION:  9164                                 [facility]ILITY:  [facility]   PHYSICIAN:  Freddy Finner, M.D.                DATE OF BIRTH:  30-Oct-1975   DATE OF PROCEDURE:  12/08/2001  DATE OF DISCHARGE:                                 OPERATIVE REPORT   PREOPERATIVE DIAGNOSES:  1. Intrauterine pregnancy, 38-1/[redacted] weeks gestation.  2. Late onset pregnancy-induced hypertension.  3. Failure to progress in labor with attempted induction of labor.   POSTOPERATIVE DIAGNOSES:  1. Intrauterine pregnancy, 38-1/[redacted] weeks gestation.  2. Late onset pregnancy-induced hypertension.  3. Failure to progress in labor with attempted induction of labor.   OPERATIVE PROCEDURE:  Primary low transverse cervical cesarean section.   SURGEON:  Freddy Finner, M.D.   ANESTHESIA:  Spinal.   ESTIMATED INTRAOPERATIVE BLOOD LOSS:  Less than or equal to 800 cc.   INTRAOPERATIVE COMPLICATIONS:  None.   INDICATIONS:  The patient is a 35 year old primigravida who had late onset  pregnancy-induced hypertension.  She was admitted for two-stage induction on  December 07, 2001.  Her cervix was very unfavorable and remained so even  after Cytotec and approximately 8-10 hours of IV Pitocin.  Her cervix was  essentially unchanged.  She had worsening of her clinical symptoms and  hypertension during labor and was started on magnesium sulfate.  With  essentially no progress in her cervix, which was still long and unfavorable,  it was elected to proceed with Cesarean delivery.   DESCRIPTION OF PROCEDURE:  The patient was brought to the operating room,  placed under adequate spinal anesthesia, and placed in the dorsal recumbent  position with elevation of the right hip about 15 degrees.  A Foley catheter  was indwelling.  The lower abdomen was prepped and draped in the usual  fashion.  A  lower abdominal transverse incision was made and carried sharply  down to fascia, which was entered sharply and extended the extent of the  skin incision.  The rectus sheath was developed superiorly and inferiorly  with blunt and sharp dissection.  The rectus muscles were divided in the  midline.  The peritoneum was elevated, entered sharply, and extended bluntly  to the extent of the skin incisions.  A bladder blade was placed.  A  transverse incision was made in the peritoneum overlying the lower uterine  segment and bladder dissected off of the lower segment.  An incision was  made in the lower uterine segment and extended bluntly in a transverse  direction.  Using vacuum extractor, the viable female infant was delivered  without significant difficulty.  Apgars were 9 and 9.  The cord pH is  pending at the time of this dictation.  The placenta and other products of  conception  were removed from the uterus.  The uterus was delivered through  the abdominal incision.  The uterus itself was normal.  The tubes and  ovaries were normal.  The uterine incision was closed in a single layer of  running locking 0 Monocryl.  Hemostasis was adequate.  The uterus was placed  back within the abdominal cavity.  Irrigation was carried out.  With  appropriate pack, needle, and instrument counts, the abdominal incision was  closed in layers.  Running 0 Monocryl was used to close the peritoneum and  reapproximate the rectus muscles.  The fascia was closed with running 0 PDS.  The subcutaneous vessels were controlled with a Bovie.  The subcutaneous  tissues were approximated with running 2-0 plain suture.  The skin was  closed with skin staples and 1/4-inch Steri-Strips.  The patient tolerated  the operative procedure and was taken to the recovery room in good  condition.  She will be admitted to the AICU for continued administration of  IV magnesium.                                               Freddy Finner, M.D.    WRN/MEDQ  D:  12/08/2001  T:  12/08/2001  Job:  [id number]

## 2010-11-07 LAB — CBC
HCT: 38.5
Hemoglobin: 13.2
MCHC: 34.3
MCV: 84
Platelets: 259
RBC: 4.59
RDW: 13.9
WBC: 8.5

## 2010-11-07 LAB — PROTIME-INR
INR: 0.9
Prothrombin Time: 12.3

## 2010-11-07 LAB — APTT: aPTT: 28

## 2010-11-08 LAB — CBC
HCT: 32.2 — ABNORMAL LOW
Hemoglobin: 11.1 — ABNORMAL LOW
MCV: 85
Platelets: 205
RDW: 13.7

## 2011-04-06 ENCOUNTER — Ambulatory Visit (INDEPENDENT_AMBULATORY_CARE_PROVIDER_SITE_OTHER): Payer: PRIVATE HEALTH INSURANCE | Admitting: Internal Medicine

## 2011-04-06 VITALS — BP 125/78 | HR 82 | Temp 97.9°F | Resp 16 | Ht 67.75 in | Wt 253.4 lb

## 2011-04-06 DIAGNOSIS — J329 Chronic sinusitis, unspecified: Secondary | ICD-10-CM

## 2011-04-06 DIAGNOSIS — H66009 Acute suppurative otitis media without spontaneous rupture of ear drum, unspecified ear: Secondary | ICD-10-CM

## 2011-04-06 MED ORDER — CEFDINIR 300 MG PO CAPS: 300.0000 mg | ORAL_CAPSULE | Freq: Two times a day (BID) | ORAL | Status: AC

## 2011-04-06 NOTE — Patient Instructions (Signed)
Take all your Cefdinir and use your FLonase for ear fullness and nasal congestion.  RTC if not improved in several days. Sinusitis Sinuses are air pockets within the bones of your face. The growth of bacteria within a sinus leads to infection. The infection prevents the sinuses from draining. This infection is called sinusitis. SYMPTOMS  There will be different areas of pain depending on which sinuses have become infected.  The maxillary sinuses often produce pain beneath the eyes.   Frontal sinusitis may cause pain in the middle of the forehead and above the eyes.  Other problems (symptoms) include:  Toothaches.   Colored, pus-like (purulent) drainage from the nose.   Swelling, warmth, and tenderness over the sinus areas may be signs of infection.  TREATMENT  Sinusitis is most often determined by an exam.X-rays may be taken. If x-rays have been taken, make sure you obtain your results or find out how you are to obtain them. Your caregiver may give you medications (antibiotics). These are medications that will help kill the bacteria causing the infection. You may also be given a medication (decongestant) that helps to reduce sinus swelling.  HOME CARE INSTRUCTIONS   Only take over-the-counter or prescription medicines for pain, discomfort, or fever as directed by your caregiver.   Drink extra fluids. Fluids help thin the mucus so your sinuses can drain more easily.   Applying either moist heat or ice packs to the sinus areas may help relieve discomfort.   Use saline nasal sprays to help moisten your sinuses. The sprays can be found at your local drugstore.  SEEK IMMEDIATE MEDICAL CARE IF:  You have a fever.   You have increasing pain, severe headaches, or toothache.   You have nausea, vomiting, or drowsiness.   You develop unusual swelling around the face or trouble seeing.  MAKE SURE YOU:   Understand these instructions.   Will watch your condition.   Will get help right  away if you are not doing well or get worse.  Document Released: 01/27/2005 Document Revised: 10/09/2010 Document Reviewed: 08/26/2006 [facility] Patient Information 2012 [place], [place].

## 2011-04-06 NOTE — Progress Notes (Signed)
  Subjective:    Patient ID: Jasmine Jimenez, female    DOB: 1975-07-07, 36 y.o.   MRN: [id number]  Sore Throat  This is a new problem. The current episode started 1 to 4 weeks ago. The problem has been gradually worsening. The pain is mild. Associated symptoms include congestion, ear pain and a plugged ear sensation. Pertinent negatives include no neck pain. She has tried nothing for the symptoms.  Jasmine Jimenez has chronic allergies and takes her Zyrtec daily.  She stopped using her Flonase 2 weeks ago.  Symptoms improved after 5 days of nasal congestion and sore throat, then returned this past week with worsening sinus and ear pressure, post nasal drainage.  She denies wheezing or cough.  Hysterectomy by Dr. Jennette Kettle in 2009 due to painful menses.    Review of Systems  Constitutional: Negative.   HENT: Positive for ear pain, congestion and rhinorrhea. Negative for neck pain.   Eyes: Positive for discharge.  Respiratory: Negative.   Cardiovascular: Negative.   Gastrointestinal: Negative.   Genitourinary: Negative.   Musculoskeletal: Negative.   Neurological: Negative.   Hematological: Negative.   Psychiatric/Behavioral: Negative.        Objective:   Physical Exam  Constitutional: She is oriented to person, place, and time. She appears well-developed and well-nourished.  HENT:  Head: Normocephalic.  Right Ear: External ear normal.  Left Ear: External ear normal.  Nose: Nose normal.  Mouth/Throat: Oropharynx is clear and moist. No oropharyngeal exudate.       Right TM red, partially obscured by cerumen.  Left dull and yellow  Eyes: Pupils are equal, round, and reactive to light.  Neck: Neck supple.  Cardiovascular: Normal rate, regular rhythm and normal heart sounds.   Pulmonary/Chest: Effort normal and breath sounds normal.  Abdominal: Soft. Bowel sounds are normal.  Neurological: She is alert and oriented to person, place, and time.  Skin: Skin is warm and dry.  Psychiatric: She has a  normal mood and affect. Her behavior is normal.   She is tender over her maxillary sinuses, voice is nasal.  Oropharynx is clear.       Assessment & Plan:  Left otitis media with sinusitis.  Cefdinir 300 mg BID for 10 days.  Restart Flonase, pt has new bottle at home.  RTC if not improved in 1 week, sooner if worse.

## 2011-11-29 ENCOUNTER — Ambulatory Visit (INDEPENDENT_AMBULATORY_CARE_PROVIDER_SITE_OTHER): Payer: PRIVATE HEALTH INSURANCE | Admitting: Family Medicine

## 2011-11-29 VITALS — BP 136/84 | HR 74 | Temp 98.5°F | Resp 16 | Ht 68.0 in | Wt 269.8 lb

## 2011-11-29 DIAGNOSIS — R300 Dysuria: Secondary | ICD-10-CM

## 2011-11-29 DIAGNOSIS — N390 Urinary tract infection, site not specified: Secondary | ICD-10-CM

## 2011-11-29 LAB — POCT UA - MICROSCOPIC ONLY
Casts, Ur, LPF, POC: NEGATIVE
Crystals, Ur, HPF, POC: NEGATIVE
Mucus, UA: NEGATIVE
Yeast, UA: NEGATIVE

## 2011-11-29 LAB — POCT URINALYSIS DIPSTICK
Glucose, UA: NEGATIVE
Nitrite, UA: NEGATIVE
Protein, UA: NEGATIVE
Urobilinogen, UA: 0.2

## 2011-11-29 MED ORDER — CIPROFLOXACIN HCL 500 MG PO TABS: 500.0000 mg | ORAL_TABLET | Freq: Two times a day (BID) | ORAL | Status: DC

## 2011-11-29 NOTE — Patient Instructions (Addendum)
Urinary Tract Infection Urinary tract infections (UTIs) can develop anywhere along your urinary tract. Your urinary tract is your body's drainage system for removing wastes and extra water. Your urinary tract includes two kidneys, two ureters, a bladder, and a urethra. Your kidneys are a pair of bean-shaped organs. Each kidney is about the size of your fist. They are located below your ribs, one on each side of your spine. CAUSES Infections are caused by microbes, which are microscopic organisms, including fungi, viruses, and bacteria. These organisms are so small that they can only be seen through a microscope. Bacteria are the microbes that most commonly cause UTIs. SYMPTOMS  Symptoms of UTIs may vary by age and gender of the patient and by the location of the infection. Symptoms in young women typically include a frequent and intense urge to urinate and a painful, burning feeling in the bladder or urethra during urination. Older women and men are more likely to be tired, shaky, and weak and have muscle aches and abdominal pain. A fever may mean the infection is in your kidneys. Other symptoms of a kidney infection include pain in your back or sides below the ribs, nausea, and vomiting. DIAGNOSIS To diagnose a UTI, your caregiver will ask you about your symptoms. Your caregiver also will ask to provide a urine sample. The urine sample will be tested for bacteria and white blood cells. White blood cells are made by your body to help fight infection. TREATMENT  Typically, UTIs can be treated with medication. Because most UTIs are caused by a bacterial infection, they usually can be treated with the use of antibiotics. The choice of antibiotic and length of treatment depend on your symptoms and the type of bacteria causing your infection. HOME CARE INSTRUCTIONS  If you were prescribed antibiotics, take them exactly as your caregiver instructs you. Finish the medication even if you feel better after you  have only taken some of the medication.  Drink enough water and fluids to keep your urine clear or pale yellow.  Avoid caffeine, tea, and carbonated beverages. They tend to irritate your bladder.  Empty your bladder often. Avoid holding urine for long periods of time.  Empty your bladder before and after sexual intercourse.  After a bowel movement, women should cleanse from front to back. Use each tissue only once. SEEK MEDICAL CARE IF:   You have back pain.  You develop a fever.  Your symptoms do not begin to resolve within 3 days. SEEK IMMEDIATE MEDICAL CARE IF:   You have severe back pain or lower abdominal pain.  You develop chills.  You have nausea or vomiting.  You have continued burning or discomfort with urination. MAKE SURE YOU:   Understand these instructions.  Will watch your condition.  Will get help right away if you are not doing well or get worse. Document Released: 11/06/2004 Document Revised: 07/29/2011 Document Reviewed: 03/07/2011 ExitCare Patient Information 2013 [employer].  

## 2011-11-29 NOTE — Progress Notes (Signed)
36 yo woman with 3 days of dysuria which has progressed to include malaise, lower abdominal cramps, back pain, and some urgency.  The urine has developed an unusual odor and looks cloudy.  Objective:  NAD, alert and articulate Results for orders placed in visit on 11/29/11  POCT UA - MICROSCOPIC ONLY      Component Value Range   WBC, Ur, HPF, POC tntc     RBC, urine, microscopic 0-1     Bacteria, U Microscopic trace     Mucus, UA neg     Epithelial cells, urine per micros 0-1     Crystals, Ur, HPF, POC neg     Casts, Ur, LPF, POC neg     Yeast, UA neg    POCT URINALYSIS DIPSTICK      Component Value Range   Color, UA yellow     Clarity, UA cloudy     Glucose, UA neg     Bilirubin, UA neg     Ketones, UA neg     Spec Grav, UA 1.015     Blood, UA small     pH, UA 5.5     Protein, UA neg     Urobilinogen, UA 0.2     Nitrite, UA neg     Leukocytes, UA large (3+)     No CVAT Nontender low back and abdomen  Assessment:

## 2011-12-03 LAB — URINE CULTURE: Colony Count: >=100000

## 2012-06-11 ENCOUNTER — Other Ambulatory Visit: Payer: Self-pay | Admitting: Obstetrics and Gynecology

## 2012-06-11 DIAGNOSIS — R239 Unspecified skin changes: Secondary | ICD-10-CM

## 2012-06-11 DIAGNOSIS — N6452 Nipple discharge: Secondary | ICD-10-CM

## 2012-06-25 ENCOUNTER — Ambulatory Visit
Admission: RE | Admit: 2012-06-25 | Discharge: 2012-06-25 | Disposition: A | Discharge status: Home / Self Care | Payer: PRIVATE HEALTH INSURANCE | Source: Ambulatory Visit | Attending: Obstetrics and Gynecology | Admitting: Obstetrics and Gynecology

## 2012-06-25 DIAGNOSIS — N6452 Nipple discharge: Secondary | ICD-10-CM

## 2012-06-25 DIAGNOSIS — R239 Unspecified skin changes: Secondary | ICD-10-CM

## 2012-12-11 HISTORY — PX: GASTRIC BYPASS: SHX52

## 2012-12-12 ENCOUNTER — Ambulatory Visit: Payer: Managed Care, Other (non HMO) | Admitting: Family Medicine

## 2012-12-12 VITALS — BP 122/70 | HR 68 | Temp 97.4°F | Resp 18 | Ht 68.0 in | Wt 274.6 lb

## 2012-12-12 DIAGNOSIS — Z9109 Other allergy status, other than to drugs and biological substances: Secondary | ICD-10-CM

## 2012-12-12 DIAGNOSIS — F32A Depression, unspecified: Secondary | ICD-10-CM

## 2012-12-12 DIAGNOSIS — F329 Major depressive disorder, single episode, unspecified: Secondary | ICD-10-CM

## 2012-12-12 MED ORDER — FLUTICASONE PROPIONATE 50 MCG/ACT NA SUSP: 2.0000 | Freq: Every day | NASAL | Status: DC

## 2012-12-12 MED ORDER — PAROXETINE HCL 20 MG PO TABS: 20.0000 mg | ORAL_TABLET | ORAL | Status: DC

## 2012-12-12 NOTE — Progress Notes (Signed)
This chart was scribed for Jasmine Sidle, MD by Jasmine Jimenez, Medical Scribe. This patient was seen in Room/bed 10 and the patient's care was started at 9:23 AM.  Subjective:    Patient ID: Jasmine Jimenez, female    DOB: [birth year], 37 y.o.   MRN: [id number]  HPI HPI Comments: Jasmine Jimenez is a 37 y.o. female who presents to [facility]  requesting prescription refill. Pt will be having gastric bypass surgery at [facility] on January 04, 2013. She denies having any trouble with her medications. She is not currently taking any antibiotics. Pt has been taking Vitamin D, OTC Zyrtec, nasal spray, and Paxal. Pt is the Architect  At [employer].   Review of Systems  Gastrointestinal: Negative for abdominal pain.   Past Surgical History  Procedure Laterality Date  . Abdominal hysterectomy    . Cesarean section     History   Social History  . Marital Status: Married    Spouse Name: N/A    Number of Children: N/A  . Years of Education: N/A   Occupational History  . Not on file.   Social History Main Topics  . Smoking status: Never Smoker   . Smokeless tobacco: Not on file  . Alcohol Use: Not on file  . Drug Use: Not on file  . Sexual Activity: Not on file   Other Topics Concern  . Not on file   Social History Narrative  . No narrative on file   History reviewed. No pertinent past medical history. Family History  Problem Relation Age of Onset  . Heart disease Father     Congestive Heart Failure  . Anxiety disorder Brother   . Allergies  Allergen Reactions  . Amoxicillin Itching and Rash        Objective:   Physical Exam  Nursing note and vitals reviewed. Constitutional: She is oriented to person, place, and time. She appears well-developed and well-nourished. No distress.  HENT:  Head: Atraumatic.  Neck: Normal range of motion. Neck supple.  Cardiovascular: Normal rate, regular rhythm and normal heart sounds.  Exam reveals no gallop  and no friction rub.   No murmur heard. Pulmonary/Chest: Effort normal and breath sounds normal. No respiratory distress. She has no wheezes. She has no rales. She exhibits no tenderness.  Abdominal: Soft. Bowel sounds are normal. She exhibits no distension and no mass. There is no tenderness. There is no rebound and no guarding.  Neurological: She is alert and oriented to person, place, and time.  Skin: Skin is warm and dry.  Psychiatric: She has a normal mood and affect.   Results for orders placed in visit on 11/29/11  URINE CULTURE      Result Value Range   Culture ESCHERICHIA COLI     Colony Count >=100,000 COLONIES/ML     Organism ID, Bacteria ESCHERICHIA COLI    POCT UA - MICROSCOPIC ONLY      Result Value Range   WBC, Ur, HPF, POC tntc     RBC, urine, microscopic 0-1     Bacteria, U Microscopic trace     Mucus, UA neg     Epithelial cells, urine per micros 0-1     Crystals, Ur, HPF, POC neg     Casts, Ur, LPF, POC neg     Yeast, UA neg    POCT URINALYSIS DIPSTICK      Result Value Range   Color, UA yellow     Clarity,  UA cloudy     Glucose, UA neg     Bilirubin, UA neg     Ketones, UA neg     Spec Grav, UA 1.015     Blood, UA small     pH, UA 5.5     Protein, UA neg     Urobilinogen, UA 0.2     Nitrite, UA neg     Leukocytes, UA large (3+)          Assessment & Plan:  Depression - Plan: PARoxetine (PAXIL) 20 MG tablet  Environmental allergies - Plan: fluticasone (FLONASE) 50 MCG/ACT nasal spray  Morbid obesity  Signed, Jasmine Sidle, MD

## 2013-01-12 ENCOUNTER — Telehealth: Payer: Self-pay | Admitting: *Deleted

## 2013-01-12 DIAGNOSIS — F329 Major depressive disorder, single episode, unspecified: Secondary | ICD-10-CM

## 2013-01-12 DIAGNOSIS — Z9109 Other allergy status, other than to drugs and biological substances: Secondary | ICD-10-CM

## 2013-01-12 MED ORDER — FLUTICASONE PROPIONATE 50 MCG/ACT NA SUSP: 2.0000 | Freq: Every day | NASAL | Status: DC

## 2013-01-12 MED ORDER — PAROXETINE HCL 20 MG PO TABS: 20.0000 mg | ORAL_TABLET | ORAL | Status: DC

## 2013-01-12 NOTE — Telephone Encounter (Signed)
Medications printed and at the TL desk. Please make sure she is not pregnant prior to taking the Paxil.

## 2013-01-12 NOTE — Telephone Encounter (Signed)
[facility] REQUESTING REFILL ON PAXIL 20MG  AND FLONASE.

## 2013-01-13 ENCOUNTER — Other Ambulatory Visit: Payer: Self-pay

## 2013-01-13 NOTE — Telephone Encounter (Signed)
LMOM to let her know not to take the Paxil if she is pregnant. Call back if she has any questions.

## 2013-01-17 ENCOUNTER — Other Ambulatory Visit: Payer: Self-pay | Admitting: Radiology

## 2013-01-19 ENCOUNTER — Other Ambulatory Visit: Payer: Self-pay

## 2013-01-19 DIAGNOSIS — F329 Major depressive disorder, single episode, unspecified: Secondary | ICD-10-CM

## 2013-01-19 DIAGNOSIS — Z9109 Other allergy status, other than to drugs and biological substances: Secondary | ICD-10-CM

## 2013-01-19 MED ORDER — FLUTICASONE PROPIONATE 50 MCG/ACT NA SUSP: 2.0000 | Freq: Every day | NASAL | Status: AC

## 2013-01-19 MED ORDER — PAROXETINE HCL 20 MG PO TABS: 20.0000 mg | ORAL_TABLET | ORAL | Status: DC

## 2014-05-06 ENCOUNTER — Ambulatory Visit (INDEPENDENT_AMBULATORY_CARE_PROVIDER_SITE_OTHER): Payer: Managed Care, Other (non HMO) | Admitting: Family Medicine

## 2014-05-06 VITALS — BP 104/65 | HR 52 | Temp 98.0°F | Resp 20 | Ht 67.5 in | Wt 196.2 lb

## 2014-05-06 DIAGNOSIS — F411 Generalized anxiety disorder: Secondary | ICD-10-CM | POA: Diagnosis not present

## 2014-05-06 MED ORDER — ESCITALOPRAM OXALATE 10 MG PO TABS: 10.0000 mg | ORAL_TABLET | Freq: Every day | ORAL | Status: DC

## 2014-05-06 NOTE — Progress Notes (Signed)
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 She is married and has come under some financial stresses with her husband's job being somewhat threatened.  She had been on Paxil in the remote past and underwent gastric bypass surgery. She lost 70 pounds, and that her surgeon suggest, the Paxil because of the possibility of weight gain. It took time but she eventually tapered off the Paxil one did well.  More recently because of the increased stress, she went to an urgent care where the recommended Lexapro. She's been taking Lexapro 10 mg daily for last 3 weeks and feels like is really helping her. He's had no problems with her weight.  Patient's other interests include playing cello, having started at [employer].  Objective: No acute distress, relates normally and appears to be calm and in a good mood.  I described how [facility] medicine works. I also point out that stress also complaining role in mood and feelings.  Assessment: Given the fact the patient is doing well on the Lexapro at this point that it's reasonable to continue this. Pointed out that taking for 6 months and probably lessen the chances of having a relapse with the anxiety. We talked about how she might tapered if she decides to come off of it.  This chart was scribed in my presence and reviewed by me personally.    ICD-9-CM ICD-10-CM   1. GAD (generalized anxiety disorder) 300.02 F41.1 escitalopram (LEXAPRO) 10 MG tablet     Signed, Elvina SidleKurt Yunuen Mordan, MD

## 2014-05-06 NOTE — Patient Instructions (Signed)
Generalized Anxiety Disorder Generalized anxiety disorder (GAD) is a mental disorder. It interferes with life functions, including relationships, work, and school. GAD is different from normal anxiety, which everyone experiences at some point in their lives in response to specific life events and activities. Normal anxiety actually helps us prepare for and get through these life events and activities. Normal anxiety goes away after the event or activity is over.  GAD causes anxiety that is not necessarily related to specific events or activities. It also causes excess anxiety in proportion to specific events or activities. The anxiety associated with GAD is also difficult to control. GAD can vary from mild to severe. People with severe GAD can have intense waves of anxiety with physical symptoms (panic attacks).  SYMPTOMS The anxiety and worry associated with GAD are difficult to control. This anxiety and worry are related to many life events and activities and also occur more days than not for 6 months or longer. People with GAD also have three or more of the following symptoms (one or more in children):  Restlessness.   Fatigue.  Difficulty concentrating.   Irritability.  Muscle tension.  Difficulty sleeping or unsatisfying sleep. DIAGNOSIS GAD is diagnosed through an assessment by your health care provider. Your health care provider will ask you questions aboutyour mood,physical symptoms, and events in your life. Your health care provider may ask you about your medical history and use of alcohol or drugs, including prescription medicines. Your health care provider may also do a physical exam and blood tests. Certain medical conditions and the use of certain substances can cause symptoms similar to those associated with GAD. Your health care provider may refer you to a mental health specialist for further evaluation. TREATMENT The following therapies are usually used to treat GAD:    Medication. Antidepressant medication usually is prescribed for long-term daily control. Antianxiety medicines may be added in severe cases, especially when panic attacks occur.   Talk therapy (psychotherapy). Certain types of talk therapy can be helpful in treating GAD by providing support, education, and guidance. A form of talk therapy called cognitive behavioral therapy can teach you healthy ways to think about and react to daily life events and activities.  Stress managementtechniques. These include yoga, meditation, and exercise and can be very helpful when they are practiced regularly. A mental health specialist can help determine which treatment is best for you. Some people see improvement with one therapy. However, other people require a combination of therapies. Document Released: 05/24/2012 Document Revised: 06/13/2013 Document Reviewed: 05/24/2012 ExitCare Patient Information 2015 [employer]. This information is not intended to replace advice given to you by your health care provider. Make sure you discuss any questions you have with your health care provider.  

## 2014-06-01 ENCOUNTER — Telehealth: Payer: Self-pay

## 2014-06-01 NOTE — Telephone Encounter (Signed)
Left a detailed message letting pt know her insurance will not pay for 90 at a time. I called the pharmacy and that's what they told me.

## 2014-06-01 NOTE — Telephone Encounter (Signed)
Pt states that Dr. Elbert EwingsL agreed to prescribe her lexapro 90 pills at a time for a year (4 refills). Her pharmacy only gave her 30 pills at one time. She wants to know if we can correct this.

## 2015-01-20 ENCOUNTER — Other Ambulatory Visit: Payer: Self-pay | Admitting: Family Medicine

## 2015-05-24 ENCOUNTER — Other Ambulatory Visit: Payer: Self-pay | Admitting: Family Medicine

## 2015-08-25 ENCOUNTER — Ambulatory Visit (INDEPENDENT_AMBULATORY_CARE_PROVIDER_SITE_OTHER): Payer: Managed Care, Other (non HMO) | Admitting: Physician Assistant

## 2015-08-25 VITALS — BP 118/70 | HR 58 | Temp 97.5°F | Resp 18 | Ht 68.0 in | Wt 211.1 lb

## 2015-08-25 DIAGNOSIS — F418 Other specified anxiety disorders: Secondary | ICD-10-CM

## 2015-08-25 DIAGNOSIS — F329 Major depressive disorder, single episode, unspecified: Secondary | ICD-10-CM

## 2015-08-25 DIAGNOSIS — F419 Anxiety disorder, unspecified: Principal | ICD-10-CM

## 2015-08-25 NOTE — Patient Instructions (Signed)
     IF you received an x-ray today, you will receive an invoice from [facility]. Please contact [facility] at [phone] with questions or concerns regarding your invoice.   IF you received labwork today, you will receive an invoice from [facility]. Please contact [facility] at [phone] with questions or concerns regarding your invoice.   Our billing staff will not be able to assist you with questions regarding bills from these companies.  You will be contacted with the lab results as soon as they are available. The fastest way to get your results is to activate your My Chart account. Instructions are located on the last page of this paperwork. If you have not heard from us regarding the results in 2 weeks, please contact this office.      

## 2015-08-25 NOTE — Progress Notes (Signed)
   08/25/2015 2:13 PM   DOB: 08/06/1975 / MRN: [id number]  SUBJECTIVE:  Jasmine Jimenez is a 40 y.o. female presenting for a follow up of anxiety and depression.  This problem started in 2000 and she feels this began due to her father's poor health. Today she feels "wondernful." She denies anhedonia and depression.  She is taking Lexapro 10 mg and has been taking this for roughly 1 year and she is not missing doses.    Depression screen [facility] 2/9 08/25/2015  Decreased Interest 0  Down, Depressed, Hopeless 0  PHQ - 2 Score 0   GAD 7 : Generalized Anxiety Score 08/25/2015  Nervous, Anxious, on Edge 1  Control/stop worrying 1  Worry too much - different things 1  Trouble relaxing 1  Restless 0  Easily annoyed or irritable 0  Afraid - awful might happen 0  Total GAD 7 Score 4  Anxiety Difficulty Not difficult at all      She is allergic to amoxicillin.   She  has no past medical history on file.    She  reports that she has never smoked. She does not have any smokeless tobacco history on file. She  has no sexual activity history on file. The patient  has past surgical history that includes Abdominal hysterectomy (2009); Cesarean section (2003, 2005); and Gastric bypass (12/2012).  Her family history includes Anxiety disorder in her brother; Heart disease in her father.  Review of Systems  Constitutional: Negative for fever.  Cardiovascular: Negative for chest pain and palpitations.  Skin: Negative for rash.  Psychiatric/Behavioral: Negative for depression, suicidal ideas, hallucinations, memory loss and substance abuse. The patient is not nervous/anxious and does not have insomnia.     Problem list and medications reviewed and updated by myself where necessary, and exist elsewhere in the encounter.   OBJECTIVE:  BP 118/70 mmHg  Pulse 58  Temp(Src) 97.5 F (36.4 C) (Oral)  Resp 18  Ht 5\' 8"  (1.727 m)  Wt 211 lb 2 oz (95.766 kg)  BMI 32.11 kg/m2  SpO2 98%  Physical Exam    Constitutional: She is oriented to person, place, and time.  Cardiovascular: Normal rate and regular rhythm.   Pulmonary/Chest: Effort normal and breath sounds normal.  Neurological: She is alert and oriented to person, place, and time. No cranial nerve deficit.  Psychiatric: She has a normal mood and affect. Her behavior is normal. Judgment and thought content normal.  Vitals reviewed.   No results found for this or any previous visit (from the past 72 hour(s)).  No results found.  ASSESSMENT AND PLAN  Jasmine Jimenez was seen today for form completion for work.  Diagnoses and all orders for this visit:  Anxiety and depression: Well controlled. RTC or call for Lexapro 10 mg.    The patient was advised to call or return to clinic if she does not see an improvement in symptoms, or to seek the care of the closest emergency department if she worsens with the above plan.   Deliah BostonMichael Stengel, MHS, PA-C [facility] 08/25/2015 2:13 PM

## 2015-08-30 ENCOUNTER — Other Ambulatory Visit: Payer: Self-pay | Admitting: Family Medicine

## 2015-09-23 ENCOUNTER — Other Ambulatory Visit: Payer: Self-pay | Admitting: Family Medicine

## 2016-01-29 ENCOUNTER — Ambulatory Visit (INDEPENDENT_AMBULATORY_CARE_PROVIDER_SITE_OTHER): Payer: 59 | Admitting: Family Medicine

## 2016-01-29 VITALS — BP 122/76 | HR 64 | Temp 98.0°F | Resp 18 | Ht 68.0 in | Wt 204.0 lb

## 2016-01-29 DIAGNOSIS — J3489 Other specified disorders of nose and nasal sinuses: Secondary | ICD-10-CM

## 2016-01-29 DIAGNOSIS — J069 Acute upper respiratory infection, unspecified: Secondary | ICD-10-CM

## 2016-01-29 NOTE — Patient Instructions (Addendum)
Unfortunately it appears that your symptoms are due to a virus at this time. Salt water nasal spray over-the-counter, Mucinex as needed for cough, Sudafed if needed for sinus pressure. If your symptoms are not improving within the next 2 days, especially if persistent discolored nasal discharge, would consider antibiotic for sinus infection at that time. If you have fevers or other worsening symptoms sooner, return for recheck.   Upper Respiratory Infection, Adult Most upper respiratory infections (URIs) are a viral infection of the air passages leading to the lungs. A URI affects the nose, throat, and upper air passages. The most common type of URI is nasopharyngitis and is typically referred to as "the common cold." URIs run their course and usually go away on their own. Most of the time, a URI does not require medical attention, but sometimes a bacterial infection in the upper airways can follow a viral infection. This is called a secondary infection. Sinus and middle ear infections are common types of secondary upper respiratory infections. Bacterial pneumonia can also complicate a URI. A URI can worsen asthma and chronic obstructive pulmonary disease (COPD). Sometimes, these complications can require emergency medical care and may be life threatening. What are the causes? Almost all URIs are caused by viruses. A virus is a type of germ and can spread from one person to another. What increases the risk? You may be at risk for a URI if:  You smoke.  You have chronic heart or lung disease.  You have a weakened defense (immune) system.  You are very young or very old.  You have nasal allergies or asthma.  You work in crowded or poorly ventilated areas.  You work in health care facilities or schools. What are the signs or symptoms? Symptoms typically develop 2-3 days after you come in contact with a cold virus. Most viral URIs last 7-10 days. However, viral URIs from the influenza virus  (flu virus) can last 14-18 days and are typically more severe. Symptoms may include:  Runny or stuffy (congested) nose.  Sneezing.  Cough.  Sore throat.  Headache.  Fatigue.  Fever.  Loss of appetite.  Pain in your forehead, behind your eyes, and over your cheekbones (sinus pain).  Muscle aches. How is this diagnosed? Your health care provider may diagnose a URI by:  Physical exam.  Tests to check that your symptoms are not due to another condition such as:  Strep throat.  Sinusitis.  Pneumonia.  Asthma. How is this treated? A URI goes away on its own with time. It cannot be cured with medicines, but medicines may be prescribed or recommended to relieve symptoms. Medicines may help:  Reduce your fever.  Reduce your cough.  Relieve nasal congestion. Follow these instructions at home:  Take medicines only as directed by your health care provider.  Gargle warm saltwater or take cough drops to comfort your throat as directed by your health care provider.  Use a warm mist humidifier or inhale steam from a shower to increase air moisture. This may make it easier to breathe.  Drink enough fluid to keep your urine clear or pale yellow.  Eat soups and other clear broths and maintain good nutrition.  Rest as needed.  Return to work when your temperature has returned to normal or as your health care provider advises. You may need to stay home longer to avoid infecting others. You can also use a face mask and careful hand washing to prevent spread of the virus.  Increase  the usage of your inhaler if you have asthma.  Do not use any tobacco products, including cigarettes, chewing tobacco, or electronic cigarettes. If you need help quitting, ask your health care provider. How is this prevented? The best way to protect yourself from getting a cold is to practice good hygiene.  Avoid oral or hand contact with people with cold symptoms.  Wash your hands often if  contact occurs. There is no clear evidence that vitamin C, vitamin E, echinacea, or exercise reduces the chance of developing a cold. However, it is always recommended to get plenty of rest, exercise, and practice good nutrition. Contact a health care provider if:  You are getting worse rather than better.  Your symptoms are not controlled by medicine.  You have chills.  You have worsening shortness of breath.  You have brown or red mucus.  You have yellow or brown nasal discharge.  You have pain in your face, especially when you bend forward.  You have a fever.  You have swollen neck glands.  You have pain while swallowing.  You have white areas in the back of your throat. Get help right away if:  You have severe or persistent:  Headache.  Ear pain.  Sinus pain.  Chest pain.  You have chronic lung disease and any of the following:  Wheezing.  Prolonged cough.  Coughing up blood.  A change in your usual mucus.  You have a stiff neck.  You have changes in your:  Vision.  Hearing.  Thinking.  Mood. This information is not intended to replace advice given to you by your health care provider. Make sure you discuss any questions you have with your health care provider. Document Released: 07/23/2000 Document Revised: 09/30/2015 Document Reviewed: 05/04/2013 Elsevier Interactive Patient Education  2017 [employer]     IF you received an x-ray today, you will receive an invoice from [facility]. Please contact [facility] at [phone] with questions or concerns regarding your invoice.   IF you received labwork today, you will receive an invoice from [facility]. Please contact [facility] at [phone] with questions or concerns regarding your invoice.   Our billing staff will not be able to assist you with questions regarding bills from these companies.  You will be contacted with the lab results as soon as they are available. The  fastest way to get your results is to activate your My Chart account. Instructions are located on the last page of this paperwork. If you have not heard from us regarding the results in 2 weeks, please contact this office.

## 2016-01-29 NOTE — Progress Notes (Signed)
Subjective:  By signing my name below, I, Jasmine Jimenez, attest that this documentation has been prepared under the direction and in the presence of Jasmine Staggers, MD. Electronically Signed: Stann Jimenez, Scribe. 01/29/2016 , 1:32 PM .  Patient was seen in Room 14 .   Patient ID: Jasmine Jimenez, female    DOB: 1975-12-22, 40 y.o.   MRN: [id number] Chief Complaint  Patient presents with  . Sinusitis   HPI Jasmine Jimenez is a 40 y.o. female  Patient states she started having voice hoarseness start about 3 days ago. She noticed she started losing her voice after work. She's taken sudafed 2 nights ago. She's had sinus congestion and pressure worsening. When she blows her nose, there's clear mucus. When she coughs, there's slight discoloration in her phlegm. She denies fever.   There are no active problems to display for this patient.  History reviewed. No pertinent past medical history. Past Surgical History:  Procedure Laterality Date  . ABDOMINAL HYSTERECTOMY  2009  . CESAREAN SECTION  2003, 2005  . 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 Amoxicillin Itching and Rash   Prior to Admission medications   Medication Sig Start Date End Date Taking? Authorizing Provider  Calcium Citrate-Vitamin D 500-500 MG-UNIT CHEW Chew 1 tablet by mouth 3 (three) times daily.   Yes Historical Provider, MD  cetirizine (ZYRTEC) 10 MG tablet Take 10 mg by mouth daily.   Yes Historical Provider, MD  Cyanocobalamin (B-12) 500 MCG SUBL Place 1 tablet under the tongue daily.   Yes Historical Provider, MD  escitalopram (LEXAPRO) 10 MG tablet TAKE 1 TABLET BY MOUTH EVERY DAY 09/25/15  Yes Jasmine Neas, PA-C  fluticasone (FLONASE) 50 MCG/ACT nasal spray Place 2 sprays into both nostrils daily. 01/19/13  Yes [name], MD  Multiple Vitamins-Minerals (CENTRUM ULTRA WOMENS PO) Take 2 tablets by mouth daily.   Yes Historical Provider, MD  omeprazole (PRILOSEC) 20 MG capsule Take 20 mg  by mouth daily.   Yes Historical Provider, MD  Probiotic Product (PRO-BIOTIC BLEND PO) Take 1 tablet by mouth daily.   Yes Historical Provider, MD  Probiotic Product (PRO-BIOTIC BLEND) CAPS Take by mouth.   Yes Historical Provider, MD   Social History   Social History  . Marital status: Married    Spouse name: N/A  . Number of children: N/A  . Years of education: N/A   Occupational History  . Not on file.   Social History Main Topics  . Smoking status: Never Smoker  . Smokeless tobacco: Never Used  . Alcohol use No  . Drug use: No  . Sexual activity: Not on file   Other Topics Concern  . Not on file   Social History Narrative  . No narrative on file   Review of Systems  Constitutional: Negative for appetite change, chills, diaphoresis, fatigue and fever.  HENT: Positive for congestion, sinus pain, sinus pressure and voice change.   Respiratory: Negative for cough, chest tightness, shortness of breath and wheezing.       Objective:   Physical Exam  Constitutional: She is oriented to person, place, and time. She appears well-developed and well-nourished. No distress.  HENT:  Head: Normocephalic and atraumatic.  Right Ear: Hearing, tympanic membrane, external ear and ear canal normal.  Left Ear: Hearing, tympanic membrane, external ear and ear canal normal.  Nose: Nose normal.  Mouth/Throat: Oropharynx is clear and moist. No oropharyngeal exudate.  Eyes: Conjunctivae and  EOM are normal. Pupils are equal, round, and reactive to light.  Cardiovascular: Normal rate, regular rhythm, normal heart sounds and intact distal pulses.   No murmur heard. Pulmonary/Chest: Effort normal and breath sounds normal. No respiratory distress. She has no wheezes. She has no rhonchi.  Neurological: She is alert and oriented to person, place, and time.  Skin: Skin is warm and dry. No rash noted.  Psychiatric: She has a normal mood and affect. Her behavior is normal.  Vitals  reviewed.  Vitals:   01/29/16 1202  BP: 122/76  Pulse: 64  Resp: 18  Temp: 98 F (36.7 C)  TempSrc: Oral  SpO2: 99%  Weight: 204 lb (92.5 kg)  Height: 5\' 8"  (1.727 m)      Assessment & Plan:    Jasmine Jimenez is a 40 y.o. female Acute upper respiratory infection  Sinus pressure  Suspected viral uri with sinus pressure. Symptomatic care discussed with mucinex, saline NS, sudafed if needed - side effects discussed. RTC precautions given and consider abx if not starting to improve in next few days.   No orders of the defined types were placed in this encounter.  Patient Instructions    Unfortunately it appears that your symptoms are due to a virus at this time. Salt water nasal spray over-the-counter, Mucinex as needed for cough, Sudafed if needed for sinus pressure. If your symptoms are not improving within the next 2 days, especially if persistent discolored nasal discharge, would consider antibiotic for sinus infection at that time. If you have fevers or other worsening symptoms sooner, return for recheck.   Upper Respiratory Infection, Adult Most upper respiratory infections (URIs) are a viral infection of the air passages leading to the lungs. A URI affects the nose, throat, and upper air passages. The most common type of URI is nasopharyngitis and is typically referred to as "the common cold." URIs run their course and usually go away on their own. Most of the time, a URI does not require medical attention, but sometimes a bacterial infection in the upper airways can follow a viral infection. This is called a secondary infection. Sinus and middle ear infections are common types of secondary upper respiratory infections. Bacterial pneumonia can also complicate a URI. A URI can worsen asthma and chronic obstructive pulmonary disease (COPD). Sometimes, these complications can require emergency medical care and may be life threatening. What are the causes? Almost all URIs are  caused by viruses. A virus is a type of germ and can spread from one person to another. What increases the risk? You may be at risk for a URI if:  You smoke.  You have chronic heart or lung disease.  You have a weakened defense (immune) system.  You are very young or very old.  You have nasal allergies or asthma.  You work in crowded or poorly ventilated areas.  You work in health care facilities or schools. What are the signs or symptoms? Symptoms typically develop 2-3 days after you come in contact with a cold virus. Most viral URIs last 7-10 days. However, viral URIs from the influenza virus (flu virus) can last 14-18 days and are typically more severe. Symptoms may include:  Runny or stuffy (congested) nose.  Sneezing.  Cough.  Sore throat.  Headache.  Fatigue.  Fever.  Loss of appetite.  Pain in your forehead, behind your eyes, and over your cheekbones (sinus pain).  Muscle aches. How is this diagnosed? Your health care provider may diagnose a  URI by:  Physical exam.  Tests to check that your symptoms are not due to another condition such as:  Strep throat.  Sinusitis.  Pneumonia.  Asthma. How is this treated? A URI goes away on its own with time. It cannot be cured with medicines, but medicines may be prescribed or recommended to relieve symptoms. Medicines may help:  Reduce your fever.  Reduce your cough.  Relieve nasal congestion. Follow these instructions at home:  Take medicines only as directed by your health care provider.  Gargle warm saltwater or take cough drops to comfort your throat as directed by your health care provider.  Use a warm mist humidifier or inhale steam from a shower to increase air moisture. This may make it easier to breathe.  Drink enough fluid to keep your urine clear or pale yellow.  Eat soups and other clear broths and maintain good nutrition.  Rest as needed.  Return to work when your temperature has  returned to normal or as your health care provider advises. You may need to stay home longer to avoid infecting others. You can also use a face mask and careful hand washing to prevent spread of the virus.  Increase the usage of your inhaler if you have asthma.  Do not use any tobacco products, including cigarettes, chewing tobacco, or electronic cigarettes. If you need help quitting, ask your health care provider. How is this prevented? The best way to protect yourself from getting a cold is to practice good hygiene.  Avoid oral or hand contact with people with cold symptoms.  Wash your hands often if contact occurs. There is no clear evidence that vitamin C, vitamin E, echinacea, or exercise reduces the chance of developing a cold. However, it is always recommended to get plenty of rest, exercise, and practice good nutrition. Contact a health care provider if:  You are getting worse rather than better.  Your symptoms are not controlled by medicine.  You have chills.  You have worsening shortness of breath.  You have brown or red mucus.  You have yellow or brown nasal discharge.  You have pain in your face, especially when you bend forward.  You have a fever.  You have swollen neck glands.  You have pain while swallowing.  You have white areas in the back of your throat. Get help right away if:  You have severe or persistent:  Headache.  Ear pain.  Sinus pain.  Chest pain.  You have chronic lung disease and any of the following:  Wheezing.  Prolonged cough.  Coughing up blood.  A change in your usual mucus.  You have a stiff neck.  You have changes in your:  Vision.  Hearing.  Thinking.  Mood. This information is not intended to replace advice given to you by your health care provider. Make sure you discuss any questions you have with your health care provider. Document Released: 07/23/2000 Document Revised: 09/30/2015 Document Reviewed:  05/04/2013 Elsevier Interactive Patient Education  2017 [employer].     IF you received an x-ray today, you will receive an invoice from [facility]. Please contact [facility] at [phone] with questions or concerns regarding your invoice.   IF you received labwork today, you will receive an invoice from [facility]. Please contact [facility] at [phone] with questions or concerns regarding your invoice.   Our billing staff will not be able to assist you with questions regarding bills from these companies.  You will be contacted with the  lab results as soon as they are available. The fastest way to get your results is to activate your My Chart account. Instructions are located on the last page of this paperwork. If you have not heard from us regarding the results in 2 weeks, please contact this office.       I personally performed the services described in this documentation, which was scribed in my presence. The recorded information has been reviewed and considered, and addended by me as needed.   Signed,   Jasmine StaggersJeffrey Senan Urey, MD [facility].  02/01/16 11:49 PM

## 2016-02-12 ENCOUNTER — Other Ambulatory Visit: Payer: Self-pay | Admitting: Family Medicine

## 2017-03-05 ENCOUNTER — Other Ambulatory Visit: Payer: Self-pay | Admitting: Physician Assistant

## 2017-06-10 ENCOUNTER — Other Ambulatory Visit: Payer: Self-pay | Admitting: Physician Assistant

## 2017-06-11 NOTE — Telephone Encounter (Signed)
Lexapro refill Last OV: 08/25/2015 Last Refill:03/05/17 #90  Pharmacy:[facility]  Filling for 15 days- left message for pt to make OV

## 2020-05-14 ENCOUNTER — Other Ambulatory Visit: Payer: Self-pay | Admitting: Unknown Physician Specialty

## 2020-05-14 DIAGNOSIS — H9122 Sudden idiopathic hearing loss, left ear: Secondary | ICD-10-CM

## 2020-05-29 ENCOUNTER — Other Ambulatory Visit: Payer: Self-pay

## 2020-05-29 ENCOUNTER — Ambulatory Visit
Admission: RE | Admit: 2020-05-29 | Discharge: 2020-05-29 | Disposition: A | Discharge status: Home / Self Care | Payer: No Typology Code available for payment source | Source: Ambulatory Visit | Attending: Unknown Physician Specialty | Admitting: Unknown Physician Specialty

## 2020-05-29 DIAGNOSIS — H9122 Sudden idiopathic hearing loss, left ear: Secondary | ICD-10-CM

## 2020-05-29 MED ORDER — GADOBENATE DIMEGLUMINE 529 MG/ML IV SOLN
20.0000 mL | Freq: Once | INTRAVENOUS | Status: AC | PRN
  Administered 2020-05-29: 20 mL via INTRAVENOUS

## 2020-06-06 ENCOUNTER — Other Ambulatory Visit: Payer: PRIVATE HEALTH INSURANCE

## 2021-04-10 DIAGNOSIS — Z124 Encounter for screening for malignant neoplasm of cervix: Secondary | ICD-10-CM | POA: Diagnosis not present

## 2021-04-10 DIAGNOSIS — Z1231 Encounter for screening mammogram for malignant neoplasm of breast: Secondary | ICD-10-CM | POA: Diagnosis not present

## 2021-04-10 DIAGNOSIS — Z01419 Encounter for gynecological examination (general) (routine) without abnormal findings: Secondary | ICD-10-CM | POA: Diagnosis not present

## 2021-04-10 DIAGNOSIS — Z6833 Body mass index (BMI) 33.0-33.9, adult: Secondary | ICD-10-CM | POA: Diagnosis not present

## 2021-04-10 DIAGNOSIS — Z1151 Encounter for screening for human papillomavirus (HPV): Secondary | ICD-10-CM | POA: Diagnosis not present

## 2021-11-21 DIAGNOSIS — M26623 Arthralgia of bilateral temporomandibular joint: Secondary | ICD-10-CM | POA: Diagnosis not present

## 2022-04-11 DIAGNOSIS — D333 Benign neoplasm of cranial nerves: Secondary | ICD-10-CM | POA: Diagnosis not present

## 2022-04-11 DIAGNOSIS — H9190 Unspecified hearing loss, unspecified ear: Secondary | ICD-10-CM | POA: Diagnosis not present

## 2022-04-29 DIAGNOSIS — R930 Abnormal findings on diagnostic imaging of skull and head, not elsewhere classified: Secondary | ICD-10-CM | POA: Diagnosis not present

## 2022-04-29 DIAGNOSIS — D333 Benign neoplasm of cranial nerves: Secondary | ICD-10-CM | POA: Diagnosis not present

## 2022-05-02 DIAGNOSIS — D333 Benign neoplasm of cranial nerves: Secondary | ICD-10-CM | POA: Diagnosis not present

## 2022-06-12 DIAGNOSIS — Z01419 Encounter for gynecological examination (general) (routine) without abnormal findings: Secondary | ICD-10-CM | POA: Diagnosis not present

## 2022-06-12 DIAGNOSIS — Z1231 Encounter for screening mammogram for malignant neoplasm of breast: Secondary | ICD-10-CM | POA: Diagnosis not present

## 2022-06-12 DIAGNOSIS — Z6833 Body mass index (BMI) 33.0-33.9, adult: Secondary | ICD-10-CM | POA: Diagnosis not present

## 2022-06-12 DIAGNOSIS — E8941 Symptomatic postprocedural ovarian failure: Secondary | ICD-10-CM | POA: Diagnosis not present

## 2022-07-09 IMAGING — MR MR BRAIN/IAC WO/W CM
12 of 13 series · 44 of 48 positions shown · IV contrast (19 ml multihance)
Comparison: None.

CLINICAL DATA: Left ear hearing loss and tinnitus. Daily headaches.

EXAM:
MRI HEAD WITHOUT AND WITH CONTRAST
TECHNIQUE: Multiplanar, multiecho pulse sequences of the brain and surrounding
structures were obtained without and with intravenous contrast.
CONTRAST:  20mL MULTIHANCE GADOBENATE DIMEGLUMINE 529 MG/ML IV SOLN

[Series 9: T1 · sagittal · 4.0mm · 0.72mm/px · 1 of 27 slices shown (1 of 3)]
[im 1/27]
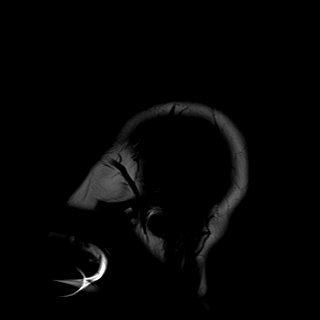

[Series 10: DWI · axial · 3.0mm · 0.94mm/px · z∈[-72,+79]mm · 11 of 170 slices shown]
[im 1/170]
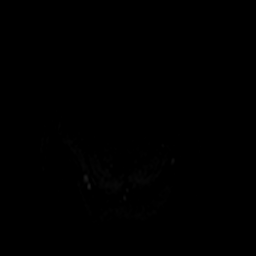
[im 17/170]
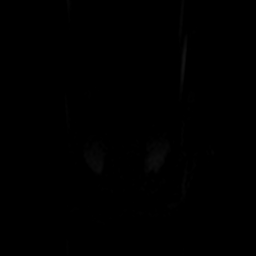
[im 34/170]
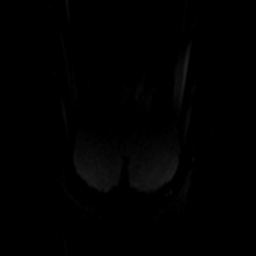
[im 51/170]
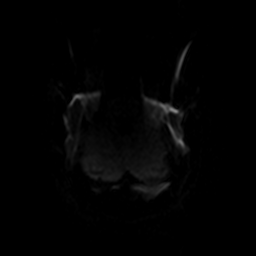
[im 68/170]
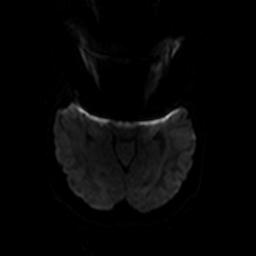
[im 85/170]
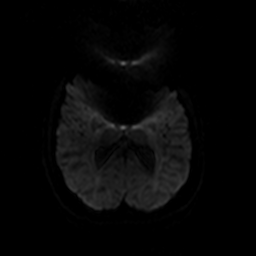
[im 102/170]
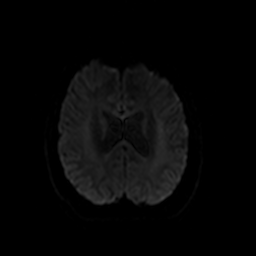
[im 119/170]
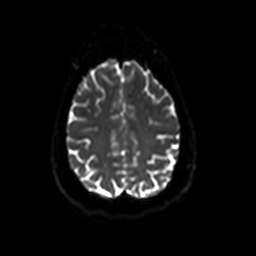
[im 136/170]
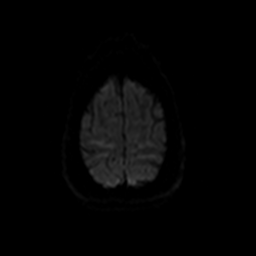
[im 153/170]
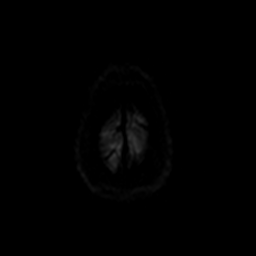
[im 170/170]
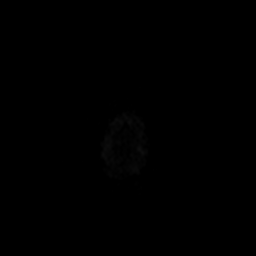

[Series 11: ax dwi_tracew · axial · 3.0mm · 0.94mm/px · z∈[-72,+79]mm · 5 of 86 slices shown]
[im 1/86]
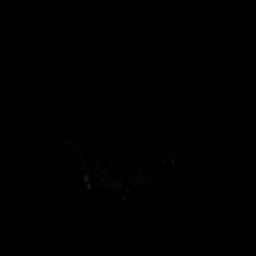
[im 22/86]
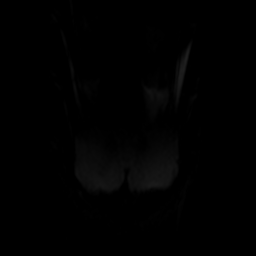
[im 43/86]
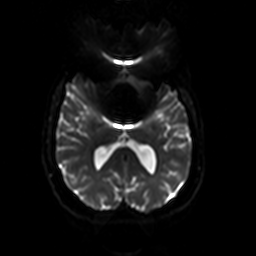
[im 64/86]
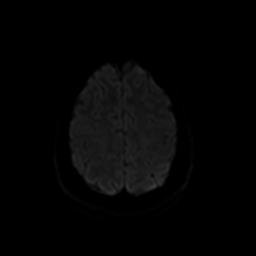
[im 86/86]
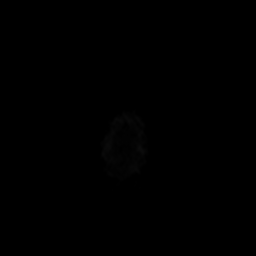

[Series 12: ax dwi_adc · axial · 3.0mm · 0.94mm/px · z∈[-72,+79]mm · 3 of 44 slices shown]
[im 1/44]
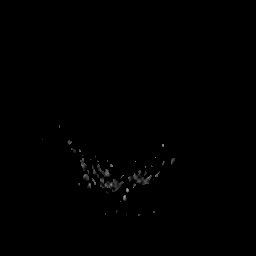
[im 22/44]
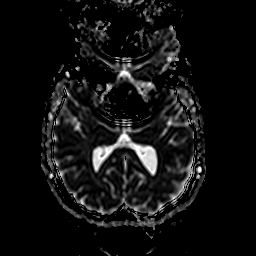
[im 44/44]
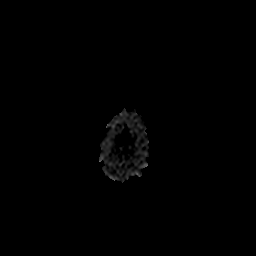

[Series 13: T2 · axial · 4.0mm · 0.36mm/px · z∈[-68,+78]mm · 2 of 30 slices shown]
[im 1/30]
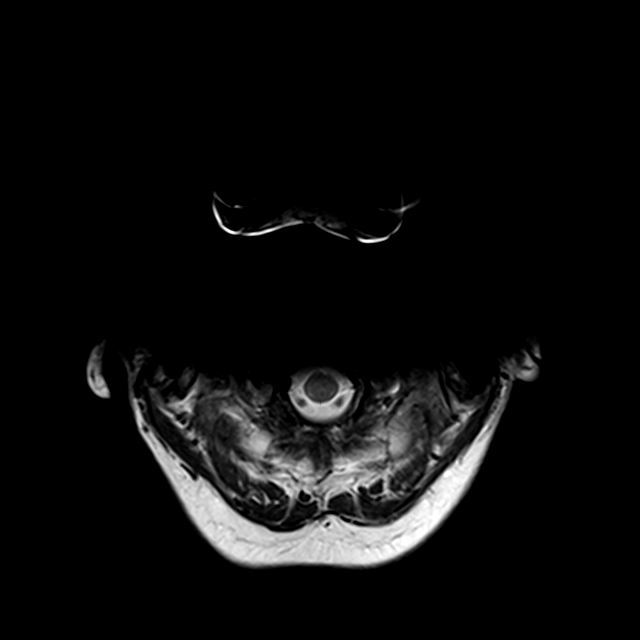
[im 30/30]
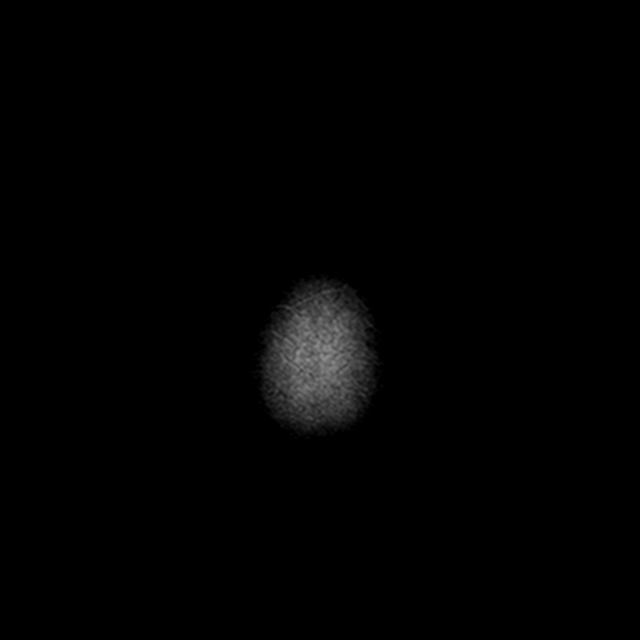

[Series 14: FLAIR · axial · 3.0mm · 0.72mm/px · z∈[-73,+83]mm · 2 of 28 slices shown]
[im 1/28]
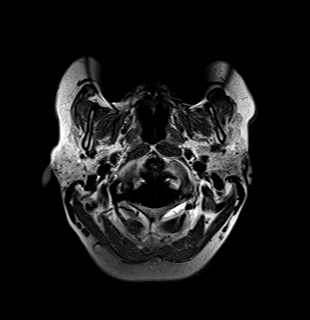
[im 28/28]
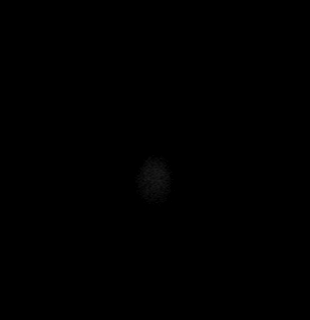

[Series 16: swi_images · axial · 1.5mm · 0.90mm/px · z∈[-63,+74]mm · 6 of 96 slices shown]
[im 1/96]
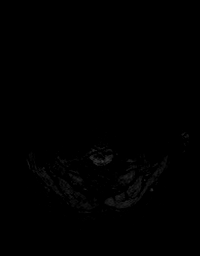
[im 20/96]
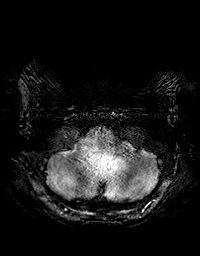
[im 39/96]
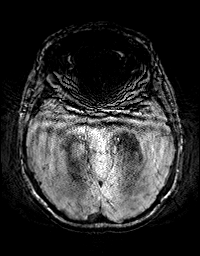
[im 58/96]
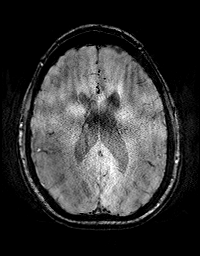
[im 77/96]
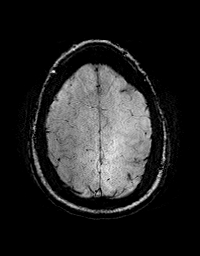
[im 96/96]
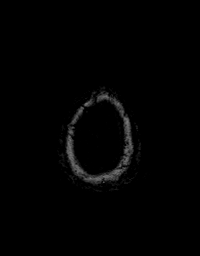

[Series 17: T1 · coronal · 3.0mm · 0.56mm/px · 1 of 13 slices shown (2 of 3)]
[im 1/13]
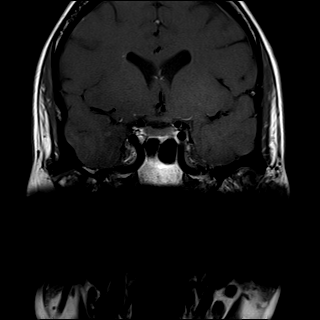

[Series 18: T1 · axial · 3.0mm · 0.50mm/px · 1 of 13 slices shown (3 of 3)]
[im 1/13]
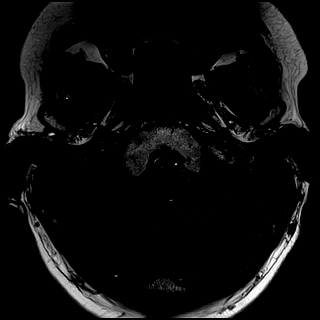

[Series 20: T1 post-contrast · coronal · 3.0mm · 0.56mm/px · 1 of 13 slices shown (1 of 3)]
[im 1/13]
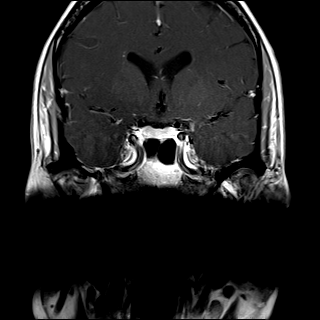

[Series 21: T1 post-contrast · axial · 3.0mm · 0.50mm/px · 1 of 13 slices shown (2 of 3)]
[im 1/13]
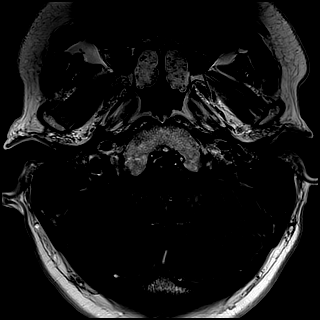

[Series 22: T1 post-contrast · axial · 1.0mm · 0.90mm/px · z∈[-70,+84]mm · 10 of 160 slices shown (3 of 3)]
[im 1/160]
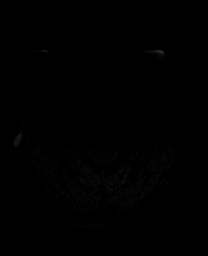
[im 18/160]
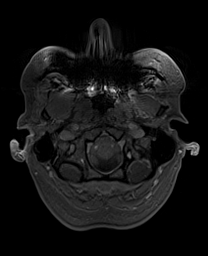
[im 36/160]
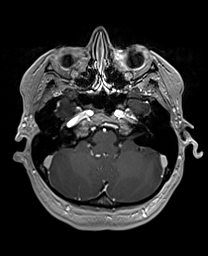
[im 54/160]
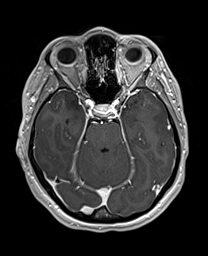
[im 71/160]
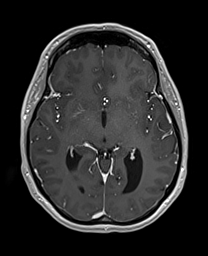
[im 89/160]
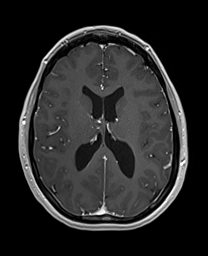
[im 107/160]
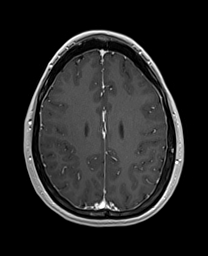
[im 124/160]
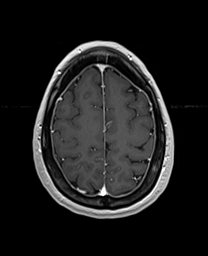
[im 142/160]
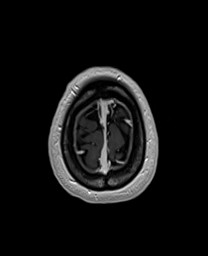
[im 160/160]
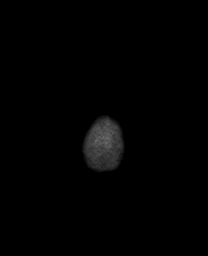

[44 of 48 positions shown; findings below may reference images not displayed]

FINDINGS: Brain: Large susceptibility artifact from braces. No acute infarct
visible. No acute or chronic hemorrhage. Minimal, frontal
predominant, multifocal hyperintense T2-weighted signal in the white
matter, as may be seen in patients with migraines, but is also
commonly present in asymptomatic patients. The midline structures
are normal. There is a 2 mm focus of contrast enhancement within the
left internal auditory canal ([DATE], [DATE]). This measures
approximately 2 mm. The right internal auditory canal is normal. The
other inner ear and labyrinthine structures are normal.

Vascular: Major flow voids are preserved.

Skull and upper cervical spine: Normal calvarium and skull base.
Visualized upper cervical spine and soft tissues are normal.

Sinuses/Orbits:No paranasal sinus fluid levels or advanced mucosal
thickening. No mastoid or middle ear effusion. Normal orbits.
IMPRESSION: 1. 2 mm focus of contrast enhancement within the left internal
auditory canal, possibly a small vestibular schwannoma.
2. Minimal frontal predominant, multifocal hyperintense T2-weighted
signal in the white matter, as can be seen in patients with
migraines, but is also commonly present in asymptomatic patients.

## 2023-11-02 DIAGNOSIS — N959 Unspecified menopausal and perimenopausal disorder: Secondary | ICD-10-CM | POA: Diagnosis not present

## 2023-11-02 DIAGNOSIS — Z6834 Body mass index (BMI) 34.0-34.9, adult: Secondary | ICD-10-CM | POA: Diagnosis not present

## 2023-11-02 DIAGNOSIS — N951 Menopausal and female climacteric states: Secondary | ICD-10-CM | POA: Diagnosis not present

## 2023-11-02 DIAGNOSIS — Z1231 Encounter for screening mammogram for malignant neoplasm of breast: Secondary | ICD-10-CM | POA: Diagnosis not present

## 2023-11-02 DIAGNOSIS — Z01419 Encounter for gynecological examination (general) (routine) without abnormal findings: Secondary | ICD-10-CM | POA: Diagnosis not present

## 2023-12-21 DIAGNOSIS — N951 Menopausal and female climacteric states: Secondary | ICD-10-CM | POA: Diagnosis not present
